# Patient Record
Sex: Female | Born: 2000 | Race: Black or African American | Hispanic: No | Marital: Single | Smoking: Never smoker
Health system: Southern US, Community
[De-identification: ages and names within clinical notes are randomized; demographics above are authoritative.]

## PROBLEM LIST (undated history)

## (undated) DIAGNOSIS — K2970 Gastritis, unspecified, without bleeding: Secondary | ICD-10-CM

## (undated) DIAGNOSIS — J45909 Unspecified asthma, uncomplicated: Secondary | ICD-10-CM

## (undated) DIAGNOSIS — R569 Unspecified convulsions: Secondary | ICD-10-CM

## (undated) HISTORY — PX: SURGERY OF LIP: SUR1315

---
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
## 2020-02-05 ENCOUNTER — Other Ambulatory Visit: Payer: Self-pay

## 2020-02-05 ENCOUNTER — Ambulatory Visit (HOSPITAL_COMMUNITY)
Admission: EM | Admit: 2020-02-05 | Discharge: 2020-02-05 | Disposition: A | Discharge status: Home / Self Care | Payer: Medicaid Other

## 2020-02-06 ENCOUNTER — Other Ambulatory Visit: Payer: Self-pay

## 2020-02-06 ENCOUNTER — Ambulatory Visit
Admission: EM | Admit: 2020-02-06 | Discharge: 2020-02-06 | Disposition: A | Discharge status: Home / Self Care | Payer: Medicaid Other | Attending: Emergency Medicine | Admitting: Emergency Medicine

## 2020-02-06 ENCOUNTER — Encounter: Payer: Self-pay | Admitting: Emergency Medicine

## 2020-02-06 DIAGNOSIS — J069 Acute upper respiratory infection, unspecified: Secondary | ICD-10-CM | POA: Diagnosis not present

## 2020-02-06 DIAGNOSIS — Z20822 Contact with and (suspected) exposure to covid-19: Secondary | ICD-10-CM

## 2020-02-06 MED ORDER — FLUTICASONE PROPIONATE 50 MCG/ACT NA SUSP: 1.0000 | Freq: Every day | NASAL | 0 refills | Status: DC

## 2020-02-06 MED ORDER — BENZONATATE 200 MG PO CAPS: 200.0000 mg | ORAL_CAPSULE | Freq: Three times a day (TID) | ORAL | 0 refills | Status: AC | PRN

## 2020-02-06 MED ORDER — ONDANSETRON 4 MG PO TBDP: 4.0000 mg | ORAL_TABLET | Freq: Three times a day (TID) | ORAL | 0 refills | Status: DC | PRN

## 2020-02-06 NOTE — ED Provider Notes (Signed)
[name]    CSN: [id number] Arrival date & time: 02/06/20  4098      History   Chief Complaint Chief Complaint  Patient presents with   Cough    HPI Kathy Yang is a 19 y.o. female history of asthma presenting today for evaluation of a cough.  Reports over the past 3 days she has had URI symptoms of cough and congestion.  Denies sore throat.  Has also had some nausea and vomiting.  Denies abdominal pain or diarrhea.  Denies any fevers.  Reports possible indirect Covid exposure at work.  HPI  Past Medical History:  Diagnosis Date   Asthma    Gastritis    Seizures ([facility])     There are no problems to display for this patient.   Past Surgical History:  Procedure Laterality Date   SURGERY OF LIP      OB History   No obstetric history on file.      Home Medications    Prior to Admission medications   Medication Sig Start Date End Date Taking? Authorizing Provider  albuterol (PROVENTIL HFA;VENTOLIN HFA) 108 (90 Base) MCG/ACT inhaler Inhale 2 puffs into the lungs every 4 (four) hours as needed for wheezing or shortness of breath. 01/30/18   McDonald, Mia A, PA-C  benzonatate (TESSALON) 200 MG capsule Take 1 capsule (200 mg total) by mouth 3 (three) times daily as needed for up to 7 days for cough. 02/06/20 02/13/20  Suhailah Kwan C, PA-C  fluticasone (FLONASE) 50 MCG/ACT nasal spray Place 1-2 sprays into both nostrils daily. 02/06/20   Amaka Gluth C, PA-C  loperamide (IMODIUM A-D) 2 MG tablet Take 1 tablet (2 mg total) by mouth 4 (four) times daily as needed for diarrhea or loose stools. 04/22/18   Vanetta Mulders, MD  ondansetron (ZOFRAN ODT) 4 MG disintegrating tablet Take 1 tablet (4 mg total) by mouth every 8 (eight) hours as needed for nausea or vomiting. 02/06/20   Nadirah Socorro C, PA-C  pantoprazole (PROTONIX) 40 MG tablet Take 40 mg by mouth daily.    [provider]    Family History Family History  Problem Relation Age  of Onset   Healthy Mother     Social History Social History   Tobacco Use   Smoking status: Never Smoker   Smokeless tobacco: Never Used  Building services engineer Use: Never used  Substance Use Topics   Alcohol use: No   Drug use: No     Allergies   Patient has no known allergies.   Review of Systems Review of Systems  Constitutional: Negative for activity change, appetite change, chills, fatigue and fever.  HENT: Positive for congestion and rhinorrhea. Negative for ear pain, sinus pressure, sore throat and trouble swallowing.   Eyes: Negative for discharge and redness.  Respiratory: Positive for cough. Negative for chest tightness and shortness of breath.   Cardiovascular: Negative for chest pain.  Gastrointestinal: Positive for nausea and vomiting. Negative for abdominal pain and diarrhea.  Musculoskeletal: Negative for myalgias.  Skin: Negative for rash.  Neurological: Negative for dizziness, light-headedness and headaches.     Physical Exam Triage Vital Signs ED Triage Vitals [02/06/20 0851]  Enc Vitals Group     BP 108/69     Pulse Rate 90     Resp 16     Temp 98.5 F (36.9 C)     Temp Source Oral     SpO2 98 %     Weight  Height      Head Circumference      Peak Flow      Pain Score      Pain Loc      Pain Edu?      Excl. in GC?    No data found.  Updated Vital Signs BP 108/69 (BP Location: Right Arm)    Pulse 90    Temp 98.5 F (36.9 C) (Oral)    Resp 16    SpO2 98%   Visual Acuity Right Eye Distance:   Left Eye Distance:   Bilateral Distance:    Right Eye Near:   Left Eye Near:    Bilateral Near:     Physical Exam Vitals and nursing note reviewed.  Constitutional:      Appearance: She is well-developed and well-nourished.     Comments: No acute distress  HENT:     Head: Normocephalic and atraumatic.     Ears:     Comments: Bilateral ears without tenderness to palpation of external auricle, tragus and mastoid, EAC's without  erythema or swelling, TM's with good bony landmarks and cone of light. Non erythematous.     Nose: Nose normal.     Mouth/Throat:     Comments: Oral mucosa pink and moist, no tonsillar enlargement or exudate. Posterior pharynx patent and nonerythematous, no uvula deviation or swelling. Normal phonation. Eyes:     Conjunctiva/sclera: Conjunctivae normal.  Cardiovascular:     Rate and Rhythm: Normal rate.  Pulmonary:     Effort: Pulmonary effort is normal. No respiratory distress.     Comments: Breathing comfortably at rest, CTABL, no wheezing, rales or other adventitious sounds auscultated Abdominal:     General: There is no distension.  Musculoskeletal:        General: Normal range of motion.     Cervical back: Neck supple.  Skin:    General: Skin is warm and dry.  Neurological:     Mental Status: She is alert and oriented to person, place, and time.  Psychiatric:        Mood and Affect: Mood and affect normal.      UC Treatments / Results  Labs (all labs ordered are listed, but only abnormal results are displayed) Labs Reviewed  NOVEL CORONAVIRUS, NAA    EKG   Radiology No results found.  Procedures Procedures (including critical care time)  Medications Ordered in UC Medications - No data to display  Initial Impression / Assessment and Plan / UC Course  I have reviewed the triage vital signs and the nursing notes.  Pertinent labs & imaging results that were available during my care of the patient were reviewed by me and considered in my medical decision making (see chart for details).     Covid PCR pending.  Suspect viral etiology, exam reassuring.  Recommending symptomatic and supportive care of URI and GI symptoms.  Discussed strict return precautions. Patient verbalized understanding and is agreeable with plan.  Final Clinical Impressions(s) / UC Diagnoses   Final diagnoses:  Encounter for screening laboratory testing for COVID-19 virus  Viral URI with  cough     Discharge Instructions     COVID test pending Ibuprofen and tylenol for fevers/body aches/headaches Rest and fluids Flonase for congestion Tessalon for cough Zofran for nausea Follow up if not improving or worsening    ED Prescriptions    Medication Sig Dispense Auth. Provider   benzonatate (TESSALON) 200 MG capsule Take 1 capsule (200 mg total) by  mouth 3 (three) times daily as needed for up to 7 days for cough. 28 capsule Mansfield Dann C, PA-C   fluticasone (FLONASE) 50 MCG/ACT nasal spray Place 1-2 sprays into both nostrils daily. 16 g Victormanuel Mclure C, PA-C   ondansetron (ZOFRAN ODT) 4 MG disintegrating tablet Take 1 tablet (4 mg total) by mouth every 8 (eight) hours as needed for nausea or vomiting. 20 tablet Ritika Hellickson, [place] C, PA-C     PDMP not reviewed this encounter.   Lew Dawes, [place] 02/06/20 [phone]

## 2020-02-06 NOTE — ED Triage Notes (Signed)
Pt here for nasal congestion, cough and some vomiting x 3 days; pt sts had possible covid exposure from work; pt sts hx of GI issues

## 2020-02-06 NOTE — Discharge Instructions (Addendum)
COVID test pending Ibuprofen and tylenol for fevers/body aches/headaches Rest and fluids Flonase for congestion Tessalon for cough Zofran for nausea Follow up if not improving or worsening

## 2020-02-11 LAB — NOVEL CORONAVIRUS, NAA: SARS-CoV-2, NAA: DETECTED — AB

## 2020-04-22 ENCOUNTER — Ambulatory Visit
Admission: EM | Admit: 2020-04-22 | Discharge: 2020-04-22 | Disposition: A | Discharge status: Home / Self Care | Payer: Medicaid Other | Attending: Emergency Medicine | Admitting: Emergency Medicine

## 2020-04-22 ENCOUNTER — Other Ambulatory Visit: Payer: Self-pay

## 2020-04-22 DIAGNOSIS — M546 Pain in thoracic spine: Secondary | ICD-10-CM | POA: Diagnosis not present

## 2020-04-22 MED ORDER — IBUPROFEN 800 MG PO TABS: 800.0000 mg | ORAL_TABLET | Freq: Three times a day (TID) | ORAL | 0 refills | Status: DC

## 2020-04-22 MED ORDER — TIZANIDINE HCL 4 MG PO TABS: 2.0000 mg | ORAL_TABLET | Freq: Four times a day (QID) | ORAL | 0 refills | Status: DC | PRN

## 2020-04-22 NOTE — ED Triage Notes (Signed)
Pt c/o mid center back pain for months. States hx of fx tail bone 14yrs ago. Denies injury. States pain became worse yesterday while at work.

## 2020-04-22 NOTE — Discharge Instructions (Signed)
Use anti-inflammatories for pain/swelling. You may take up to 800 mg Ibuprofen every 8 hours with food. You may supplement Ibuprofen with Tylenol [phone] mg every 8 hours.   You may use tizanidine as needed to help with pain. This is a muscle relaxer and causes sedation- please use only at bedtime or when you will be home and not have to drive/work  Alternate ice and heat  Follow up with sports medicine if pain persisting
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
## 2020-05-27 ENCOUNTER — Encounter (HOSPITAL_COMMUNITY): Payer: Self-pay

## 2020-05-27 ENCOUNTER — Emergency Department (HOSPITAL_COMMUNITY)
Admission: EM | Admit: 2020-05-27 | Discharge: 2020-05-27 | Disposition: A | Discharge status: Home / Self Care | Payer: Medicaid Other | Attending: Emergency Medicine | Admitting: Emergency Medicine

## 2020-05-27 DIAGNOSIS — R112 Nausea with vomiting, unspecified: Secondary | ICD-10-CM | POA: Insufficient documentation

## 2020-05-27 DIAGNOSIS — J45909 Unspecified asthma, uncomplicated: Secondary | ICD-10-CM | POA: Insufficient documentation

## 2020-05-27 DIAGNOSIS — R197 Diarrhea, unspecified: Secondary | ICD-10-CM | POA: Insufficient documentation

## 2020-05-27 MED ORDER — ONDANSETRON 4 MG PO TBDP
4.0000 mg | ORAL_TABLET | Freq: Once | ORAL | Status: AC
  Administered 2020-05-27: 4 mg via ORAL
  Filled 2020-05-27: qty 1

## 2020-05-27 MED ORDER — PROMETHAZINE HCL 12.5 MG PO TABS: 12.5000 mg | ORAL_TABLET | Freq: Four times a day (QID) | ORAL | 0 refills | Status: AC | PRN

## 2020-05-27 MED ORDER — PROMETHAZINE HCL 25 MG PO TABS
12.5000 mg | ORAL_TABLET | Freq: Once | ORAL | Status: AC
  Administered 2020-05-27: 12.5 mg via ORAL
  Filled 2020-05-27: qty 1

## 2020-05-27 NOTE — ED Notes (Signed)
Provided patient water per fluid challenge. Patient began to vomit shortly after drinking water.

## 2020-05-27 NOTE — ED Triage Notes (Signed)
Pt arrived via walk in, c/o vomiting and diarrhea since 10pm . Denies any other sx or known sick contacts.

## 2020-05-27 NOTE — ED Provider Notes (Signed)
[facility]-EMERGENCY DEPT Provider Note   CSN: [id number] Arrival date & time: 05/27/20  0255     History Chief Complaint  Patient presents with  . Emesis    Kathy Yang is a 20 y.o. female who presents the emergency department with a chief complaint of nausea, vomiting, and diarrhea.  The patient reports sudden onset nausea, vomiting, and diarrhea, onset 10 PM.  She reports 1 episode of diarrhea and multiple episodes of nonbloody, nonbilious vomiting.  No known aggravating or alleviating factors. She reports that symptoms began after eating food.  She denies abdominal pain, fever, chills, cough, chest pain, shortness of breath, dysuria, hematuria, vaginal bleeding or discharge.  No treatment prior to arrival.  No known sick contacts.  The history is provided by the patient and medical records. No language interpreter was used.       Past Medical History:  Diagnosis Date  . Asthma   . Gastritis   . Seizures (HCC)     There are no problems to display for this patient.   Past Surgical History:  Procedure Laterality Date  . SURGERY OF LIP       OB History   No obstetric history on file.     Family History  Problem Relation Age of Onset  . Healthy Mother     Social History   Tobacco Use  . Smoking status: Never Smoker  . Smokeless tobacco: Never Used  Vaping Use  . Vaping Use: Never used  Substance Use Topics  . Alcohol use: No  . Drug use: No    Home Medications Prior to Admission medications   Medication Sig Start Date End Date Taking? Authorizing Provider  promethazine (PHENERGAN) 12.5 MG tablet Take 1 tablet (12.5 mg total) by mouth every 6 (six) hours as needed for nausea or vomiting. 05/27/20  Yes Diante Barley A, PA-C  albuterol (PROVENTIL HFA;VENTOLIN HFA) 108 (90 Base) MCG/ACT inhaler Inhale 2 puffs into the lungs every 4 (four) hours as needed for wheezing or shortness of breath. Patient not taking: Reported on 05/27/2020  01/30/18 05/27/20  Berlynn Warsame, Pedro Earls A, PA-C    Allergies    Patient has no known allergies.  Review of Systems   Review of Systems  Constitutional: Negative for activity change, chills and fever.  HENT: Negative for congestion and sore throat.   Eyes: Negative for visual disturbance.  Respiratory: Negative for cough and shortness of breath.   Cardiovascular: Negative for chest pain and palpitations.  Gastrointestinal: Positive for diarrhea, nausea and vomiting. Negative for abdominal pain and constipation.  Genitourinary: Negative for dysuria.  Musculoskeletal: Negative for back pain, myalgias, neck pain and neck stiffness.  Skin: Negative for rash.  Allergic/Immunologic: Negative for immunocompromised state.  Neurological: Negative for seizures, syncope, weakness, numbness and headaches.  Psychiatric/Behavioral: Negative for confusion.    Physical Exam Updated Vital Signs BP 105/61   Pulse 71   Temp 98.1 F (36.7 C) (Oral)   Resp 19   SpO2 98%   Physical Exam Vitals and nursing note reviewed.  Constitutional:      General: She is not in acute distress. HENT:     Head: Normocephalic.     Mouth/Throat:     Mouth: Mucous membranes are moist.     Pharynx: No oropharyngeal exudate or posterior oropharyngeal erythema.  Eyes:     Conjunctiva/sclera: Conjunctivae normal.  Cardiovascular:     Rate and Rhythm: Normal rate and regular rhythm.     Heart sounds: No  murmur heard. No friction rub. No gallop.   Pulmonary:     Effort: Pulmonary effort is normal. No respiratory distress.     Breath sounds: No stridor. No wheezing, rhonchi or rales.  Chest:     Chest wall: No tenderness.  Abdominal:     General: There is no distension.     Palpations: Abdomen is soft. There is no mass.     Tenderness: There is no abdominal tenderness. There is no right CVA tenderness, left CVA tenderness, guarding or rebound.     Hernia: No hernia is present.     Comments: Abdomen is soft,  nontender, nondistended.  Musculoskeletal:     Cervical back: Neck supple.  Skin:    General: Skin is warm.     Capillary Refill: Capillary refill takes less than 2 seconds.     Coloration: Skin is not jaundiced or pale.     Findings: No erythema or rash.  Neurological:     Mental Status: She is alert.  Psychiatric:        Behavior: Behavior normal.     ED Results / Procedures / Treatments   Labs (all labs ordered are listed, but only abnormal results are displayed) Labs Reviewed - No data to display  EKG None  Radiology No results found.  Procedures Procedures   Medications Ordered in ED Medications  ondansetron (ZOFRAN-ODT) disintegrating tablet 4 mg (4 mg Oral Given 05/27/20 0324)  promethazine (PHENERGAN) tablet 12.5 mg (12.5 mg Oral Given 05/27/20 0406)    ED Course  I have reviewed the triage vital signs and the nursing notes.  Pertinent labs & imaging results that were available during my care of the patient were reviewed by me and considered in my medical decision making (see chart for details).    MDM Rules/Calculators/A&P                          20 year old female with no chronic medical conditions who presents with nausea, vomiting, and diarrhea, onset 5 hours ago.  No abdominal pain or constitutional symptoms.  Vital signs are normal.  On exam, abdomen is benign.  In a shared decision-making conversation with the patient.  Discussed since her labs were normal and symptoms began tonight, to manage her symptoms with antiemetics and fluid challenge the patient.  She is agreement with this plan.  She was given Zofran, but continues to endorse nausea.  She was trialed with Phenergan and on reevaluation nausea had resolved.  She had no further episodes of diarrhea.  She was able to tolerate fluids without difficulty.  Reports that she was feeling much improved.  Suspect viral gastroenteritis versus undercooked food ingestion. We will discharge patient to home with  outpatient follow-up as needed.  Doubt appendicitis, cholecystitis, pancreatitis, ectopic pregnancy, pyelonephritis, bowel obstruction.  She is hemodynamically stable in no acute distress.  Safe for discharge home with outpatient follow-up as indicated.  Final Clinical Impression(s) / ED Diagnoses Final diagnoses:  Nausea vomiting and diarrhea    Rx / DC Orders ED Discharge Orders         Ordered    promethazine (PHENERGAN) 12.5 MG tablet  Every 6 hours PRN        05/27/20 0531           Barkley Boards, PA-C 05/27/20 0741    Nira Conn, MD 05/28/20 [phone]

## 2020-05-27 NOTE — Discharge Instructions (Signed)
Thank you for allowing me to care for you today in the Emergency Department.   Please keep your upcoming follow-up appointment.  You can take 1 tablet of Zofran every 6 hours as needed for nausea vomiting.  Make sure that you are drinking plenty of fluids over the next few days to prevent dehydration.  Return to the emergency department if you stop producing urine, have persistent, uncontrollable vomiting despite taking Phenergan, if you develop a fever greater than 100.5 F accompanied by severe abdominal pain, or other new, concerning symptoms.

## 2020-12-12 ENCOUNTER — Encounter (HOSPITAL_BASED_OUTPATIENT_CLINIC_OR_DEPARTMENT_OTHER): Payer: Self-pay

## 2020-12-12 ENCOUNTER — Other Ambulatory Visit: Payer: Self-pay

## 2020-12-12 ENCOUNTER — Emergency Department (HOSPITAL_BASED_OUTPATIENT_CLINIC_OR_DEPARTMENT_OTHER): Payer: Medicaid Other

## 2020-12-12 ENCOUNTER — Emergency Department (HOSPITAL_BASED_OUTPATIENT_CLINIC_OR_DEPARTMENT_OTHER)
Admission: EM | Admit: 2020-12-12 | Discharge: 2020-12-12 | Disposition: A | Discharge status: Home / Self Care | Payer: Medicaid Other | Attending: Emergency Medicine | Admitting: Emergency Medicine

## 2020-12-12 DIAGNOSIS — Y92410 Unspecified street and highway as the place of occurrence of the external cause: Secondary | ICD-10-CM | POA: Insufficient documentation

## 2020-12-12 DIAGNOSIS — S161XXA Strain of muscle, fascia and tendon at neck level, initial encounter: Secondary | ICD-10-CM | POA: Insufficient documentation

## 2020-12-12 DIAGNOSIS — S93402A Sprain of unspecified ligament of left ankle, initial encounter: Secondary | ICD-10-CM | POA: Diagnosis not present

## 2020-12-12 DIAGNOSIS — J45909 Unspecified asthma, uncomplicated: Secondary | ICD-10-CM | POA: Diagnosis not present

## 2020-12-12 DIAGNOSIS — S199XXA Unspecified injury of neck, initial encounter: Secondary | ICD-10-CM | POA: Diagnosis present

## 2020-12-12 MED ORDER — METHOCARBAMOL 500 MG PO TABS: 500.0000 mg | ORAL_TABLET | Freq: Two times a day (BID) | ORAL | 0 refills | Status: DC
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
## 2020-12-12 NOTE — ED Notes (Signed)
TRIAGE NURSING ASSESSMENT COMPLETED BY M. Dymond Spreen RN 
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
## 2021-03-14 ENCOUNTER — Encounter (HOSPITAL_COMMUNITY): Payer: Self-pay

## 2021-03-14 ENCOUNTER — Emergency Department (HOSPITAL_COMMUNITY): Payer: Medicaid Other

## 2021-03-14 ENCOUNTER — Emergency Department (HOSPITAL_COMMUNITY)
Admission: EM | Admit: 2021-03-14 | Discharge: 2021-03-14 | Disposition: A | Discharge status: Home / Self Care | Payer: Medicaid Other | Attending: Emergency Medicine | Admitting: Emergency Medicine

## 2021-03-14 DIAGNOSIS — M542 Cervicalgia: Secondary | ICD-10-CM | POA: Insufficient documentation

## 2021-03-14 DIAGNOSIS — R52 Pain, unspecified: Secondary | ICD-10-CM

## 2021-03-14 DIAGNOSIS — Z79899 Other long term (current) drug therapy: Secondary | ICD-10-CM | POA: Insufficient documentation

## 2021-03-14 DIAGNOSIS — S0990XA Unspecified injury of head, initial encounter: Secondary | ICD-10-CM | POA: Insufficient documentation

## 2021-03-14 MED ORDER — ACETAMINOPHEN 500 MG PO TABS
1000.0000 mg | ORAL_TABLET | Freq: Once | ORAL | Status: AC
  Administered 2021-03-14: 1000 mg via ORAL
  Filled 2021-03-14: qty 2

## 2021-03-14 MED ORDER — IBUPROFEN 800 MG PO TABS
800.0000 mg | ORAL_TABLET | Freq: Once | ORAL | Status: AC
  Administered 2021-03-14: 800 mg via ORAL
  Filled 2021-03-14: qty 1

## 2021-03-14 MED ORDER — METHOCARBAMOL 500 MG PO TABS: 500.0000 mg | ORAL_TABLET | Freq: Two times a day (BID) | ORAL | 0 refills | Status: DC
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
## 2021-03-14 NOTE — ED Notes (Signed)
Patient was able to ambulate down the hall. She did find ambulation difficult stopping every few steps due to back pain.
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
## 2021-06-26 ENCOUNTER — Encounter (HOSPITAL_BASED_OUTPATIENT_CLINIC_OR_DEPARTMENT_OTHER): Payer: Self-pay | Admitting: Emergency Medicine

## 2021-06-26 ENCOUNTER — Emergency Department (HOSPITAL_BASED_OUTPATIENT_CLINIC_OR_DEPARTMENT_OTHER)
Admission: EM | Admit: 2021-06-26 | Discharge: 2021-06-26 | Disposition: A | Discharge status: Home / Self Care | Payer: Medicaid Other | Attending: Emergency Medicine | Admitting: Emergency Medicine

## 2021-06-26 ENCOUNTER — Other Ambulatory Visit: Payer: Self-pay

## 2021-06-26 DIAGNOSIS — R112 Nausea with vomiting, unspecified: Secondary | ICD-10-CM | POA: Diagnosis present

## 2021-06-26 DIAGNOSIS — A084 Viral intestinal infection, unspecified: Secondary | ICD-10-CM | POA: Diagnosis not present

## 2021-06-26 LAB — CBC
HCT: 39 % (ref 36.0–46.0)
Hemoglobin: 13.5 g/dL (ref 12.0–15.0)
MCH: 30.6 pg (ref 26.0–34.0)
MCHC: 34.6 g/dL (ref 30.0–36.0)
MCV: 88.4 fL (ref 80.0–100.0)
Platelets: 162 10*3/uL (ref 150–400)
RBC: 4.41 MIL/uL (ref 3.87–5.11)
RDW: 11.9 % (ref 11.5–15.5)
WBC: 6.2 10*3/uL (ref 4.0–10.5)
nRBC: 0 % (ref 0.0–0.2)

## 2021-06-26 LAB — URINALYSIS, ROUTINE W REFLEX MICROSCOPIC
Bilirubin Urine: NEGATIVE
Glucose, UA: NEGATIVE mg/dL
Hgb urine dipstick: NEGATIVE
Ketones, ur: 15 mg/dL — AB
Leukocytes,Ua: NEGATIVE
Nitrite: NEGATIVE
Protein, ur: NEGATIVE mg/dL
Specific Gravity, Urine: 1.02 (ref 1.005–1.030)
pH: 7.5 (ref 5.0–8.0)

## 2021-06-26 LAB — HEPATIC FUNCTION PANEL
ALT: 12 U/L (ref 0–44)
AST: 16 U/L (ref 15–41)
Albumin: 4.3 g/dL (ref 3.5–5.0)
Alkaline Phosphatase: 56 U/L (ref 38–126)
Bilirubin, Direct: 0.3 mg/dL — ABNORMAL HIGH (ref 0.0–0.2)
Indirect Bilirubin: 1.9 mg/dL — ABNORMAL HIGH (ref 0.3–0.9)
Total Bilirubin: 2.2 mg/dL — ABNORMAL HIGH (ref 0.3–1.2)
Total Protein: 7.6 g/dL (ref 6.5–8.1)

## 2021-06-26 LAB — BASIC METABOLIC PANEL
Anion gap: 6 (ref 5–15)
BUN: 7 mg/dL (ref 6–20)
CO2: 23 mmol/L (ref 22–32)
Calcium: 8.8 mg/dL — ABNORMAL LOW (ref 8.9–10.3)
Chloride: 108 mmol/L (ref 98–111)
Creatinine, Ser: 0.61 mg/dL (ref 0.44–1.00)
GFR, Estimated: >60 mL/min (ref >60)
Glucose, Bld: 97 mg/dL (ref 70–99)
Potassium: 3.7 mmol/L (ref 3.5–5.1)
Sodium: 137 mmol/L (ref 135–145)

## 2021-06-26 LAB — LIPASE, BLOOD: Lipase: 27 U/L (ref 11–51)

## 2021-06-26 LAB — PREGNANCY, URINE: Preg Test, Ur: NEGATIVE

## 2021-06-26 MED ORDER — KETOROLAC TROMETHAMINE 15 MG/ML IJ SOLN
15.0000 mg | Freq: Once | INTRAMUSCULAR | Status: AC
  Administered 2021-06-26: 15 mg via INTRAVENOUS
  Filled 2021-06-26: qty 1

## 2021-06-26 MED ORDER — SODIUM CHLORIDE 0.9 % IV BOLUS
1000.0000 mL | Freq: Once | INTRAVENOUS | Status: AC
  Administered 2021-06-26: 1000 mL via INTRAVENOUS

## 2021-06-26 MED ORDER — ONDANSETRON HCL 4 MG PO TABS: 4.0000 mg | ORAL_TABLET | Freq: Four times a day (QID) | ORAL | 0 refills | Status: DC

## 2021-06-26 MED ORDER — ONDANSETRON HCL 4 MG/2ML IJ SOLN
4.0000 mg | Freq: Once | INTRAMUSCULAR | Status: AC
  Administered 2021-06-26: 4 mg via INTRAVENOUS
  Filled 2021-06-26: qty 2

## 2021-06-26 NOTE — ED Notes (Signed)
Pt aware of need for urine specimen, unable to provide at this time.  IV fluids initiated ?

## 2021-06-26 NOTE — ED Notes (Signed)
Patient states she cannt void ?

## 2021-06-26 NOTE — ED Notes (Signed)
Pt returned from bathroom, ambulating with steady gait, Iv fluids completed, pt reports ongoing headache.   ?

## 2021-06-26 NOTE — ED Provider Notes (Signed)
?[facility] EMERGENCY DEPARTMENT ?Provider Note ? ? ?CSN: [id number] ?Arrival date & time: 06/26/21  1344 ? ?  ? ?History ? ?Chief Complaint  ?Patient presents with  ? Fatigue  ? ? ?Kathy Yang is a 21 y.o. female who presents emergency department complaining of nausea, vomiting, headache, lightheadedness, and sore throat for 3 days.  Patient states that the past 3 days she vomited 1 time, but today she vomited 6 times.  She does have a history of gastritis.  She denies fever, abdominal pain, diarrhea, urinary symptoms, vaginal discharge.  States she is tolerating fluids, but not food. ? ?HPI ? ?  ? ?Home Medications ?Prior to Admission medications   ?Medication Sig Start Date End Date Taking? Authorizing Provider  ?ondansetron (ZOFRAN) 4 MG tablet Take 1 tablet (4 mg total) by mouth every 6 (six) hours. 06/26/21  Yes Azel Gumina T, PA-C  ?methocarbamol (ROBAXIN) 500 MG tablet Take 1 tablet (500 mg total) by mouth 2 (two) times daily. 03/14/21   Mickie Hillier, PA-C  ?promethazine (PHENERGAN) 12.5 MG tablet Take 1 tablet (12.5 mg total) by mouth every 6 (six) hours as needed for nausea or vomiting. 05/27/20   McDonald, Mia A, PA-C  ?albuterol (PROVENTIL HFA;VENTOLIN HFA) 108 (90 Base) MCG/ACT inhaler Inhale 2 puffs into the lungs every 4 (four) hours as needed for wheezing or shortness of breath. ?Patient not taking: Reported on 05/27/2020 01/30/18 05/27/20  McDonald, Maree Erie A, PA-C  ?   ? ?Allergies    ?Patient has no known allergies.   ? ?Review of Systems   ?Review of Systems  ?Constitutional:  Negative for fever.  ?HENT:  Positive for sore throat.   ?Respiratory:  Negative for shortness of breath.   ?Cardiovascular:  Negative for chest pain.  ?Gastrointestinal:  Positive for nausea and vomiting. Negative for abdominal pain, constipation and diarrhea.  ?Genitourinary:  Negative for dysuria, frequency, hematuria and urgency.  ?Neurological:  Positive for light-headedness and headaches.  ?All other systems  reviewed and are negative. ? ?Physical Exam ?Updated Vital Signs ?BP (!) 109/51 (BP Location: Right Arm)   Pulse (!) 101   Temp 98.6 ?F (37 ?C) (Oral)   Resp 16   Wt 53.5 kg   LMP 06/18/2021 (Exact Date)   SpO2 98%   BMI 20.90 kg/m?  ?Physical Exam ?Vitals and nursing note reviewed.  ?Constitutional:   ?   Appearance: Normal appearance.  ?HENT:  ?   Head: Normocephalic and atraumatic.  ?   Mouth/Throat:  ?   Lips: Pink.  ?   Mouth: Mucous membranes are moist.  ?   Pharynx: Oropharynx is clear. Uvula midline.  ?   Tonsils: No tonsillar exudate. 2+ on the right. 2+ on the left.  ?Eyes:  ?   Conjunctiva/sclera: Conjunctivae normal.  ?Cardiovascular:  ?   Rate and Rhythm: Normal rate and regular rhythm.  ?Pulmonary:  ?   Effort: Pulmonary effort is normal. No respiratory distress.  ?   Breath sounds: Normal breath sounds.  ?Abdominal:  ?   General: There is no distension.  ?   Palpations: Abdomen is soft.  ?   Tenderness: There is generalized abdominal tenderness. There is no guarding or rebound.  ?Skin: ?   General: Skin is warm and dry.  ?Neurological:  ?   General: No focal deficit present.  ?   Mental Status: She is alert.  ? ? ?ED Results / Procedures / Treatments   ?Labs ?(all labs ordered are listed,  but only abnormal results are displayed) ?Labs Reviewed  ?BASIC METABOLIC PANEL - Abnormal; Notable for the following components:  ?    Result Value  ? Calcium 8.8 (*)   ? All other components within normal limits  ?URINALYSIS, ROUTINE W REFLEX MICROSCOPIC - Abnormal; Notable for the following components:  ? Ketones, ur 15 (*)   ? All other components within normal limits  ?HEPATIC FUNCTION PANEL - Abnormal; Notable for the following components:  ? Total Bilirubin 2.2 (*)   ? Bilirubin, Direct 0.3 (*)   ? Indirect Bilirubin 1.9 (*)   ? All other components within normal limits  ?CBC  ?PREGNANCY, URINE  ?LIPASE, BLOOD  ? ? ?EKG ?None ? ?Radiology ?No results found. ? ?Procedures ?Procedures  ? ? ?Medications  Ordered in ED ?Medications  ?sodium chloride 0.9 % bolus 1,000 mL (0 mLs Intravenous Stopped 06/26/21 1626)  ?ondansetron [facility]) injection 4 mg (4 mg Intravenous Given 06/26/21 1519)  ?ketorolac (TORADOL) 15 MG/ML injection 15 mg (15 mg Intravenous Given 06/26/21 1632)  ? ? ?ED Course/ Medical Decision Making/ A&P ?  ?                        ?Medical Decision Making ?Amount and/or Complexity of Data Reviewed ?Labs: ordered. ? ?Risk ?Prescription drug management. ? ? ?This patient is a 21 y.o. female who presents to the ED for concern of vomiting, this involves an extensive number of treatment options, and is a complaint that carries with it a high risk of complications and morbidity. The emergent differential diagnosis prior to evaluation includes, but is not limited to,  ACS/MI, Boerhaave's, DKA, elevated ICP, Ischemic bowel, Sepsis, Drug-related (toxicity, THC hyperemesis, ETOH, withdrawal), Appendicitis, Bowel obstruction, Electrolyte abnormalities, Pancreatitis, Biliary colic, Gastroenteritis, Gastroparesis, Hepatitis, Migraine, Thyroid disease, Renal colic, GERD/PUD, UTI, Ovarian torsion, Pregnancy, Hyperemesis gravidarum . This is not an exhaustive differential.  ? ?Past Medical History / Co-morbidities / Social History: ?Asthma, seizures, gastritis ? ?Additional history: ?Chart reviewed. Pertinent results include: Patient seen several times in the past several years with similar symptoms. Dx with chronic gastritis. Does not see GI per chart review.  ? ?Physical Exam: ?Physical exam performed. The pertinent findings include: Tachycardic to 116, afebrile.  Patient in no acute distress.  Abdomen soft, generalized tenderness.  No guarding. ? ?Lab Tests: ?I ordered, and personally interpreted labs.  The pertinent results include: No leukocytosis, normal hemoglobin.  Electrolytes within normal limits.  Urinalysis negative for infection or hematuria.  Negative pregnancy.  Normal lipase. ?  ?Medications: ?I ordered  medication including IV fluids, Zofran, and Toradol for nausea, lightheadedness, and headache. Reevaluation of the patient after these medicines showed that the patient improved. I have reviewed the patients home medicines and have made adjustments as needed. ? ?Disposition: ?After consideration of the diagnostic results and the patients response to treatment, I feel that patient's not requiring admission or inpatient treatment for symptoms.  Symptoms most consistent with a viral gastroenteritis.  Repeat abdominal exam remains nonsurgical.  On reevaluation patient states that she feels much better, and is ready to go home.  Will discharge to home with prescription for Zofran.  Patient plans to follow-up with gastroenterology about her chronic gastritis, and I encouraged this.  We discussed reasons to return to the emergency department, and patient is agreeable to the plan. ? ?Final Clinical Impression(s) / ED Diagnoses ?Final diagnoses:  ?Viral gastroenteritis  ? ? ?Rx / DC Orders ?ED Discharge Orders   ? ?  Ordered  ?  ondansetron (ZOFRAN) 4 MG tablet  Every 6 hours       ? 06/26/21 1705  ? ?  ?  ? ?  ? ?Portions of this report may have been transcribed using voice recognition software. Every effort was made to ensure accuracy; however, inadvertent computerized transcription errors may be present. ? ?  ?Kateri Plummer, PA-C ?06/26/21 1738 ? ?  ?Lucrezia Starch, MD ?06/26/21 1748 ? ?

## 2021-06-26 NOTE — ED Triage Notes (Signed)
Pt reports n/v x 3 days, and now has HA and feeling lightheaded. Vomit is yellow. Denies abdominal pain, urinary sx, vaginal discharge. Hx of gastritis.  ?

## 2021-06-26 NOTE — Discharge Instructions (Addendum)
You were seen in emergency department today for fatigue and vomiting. ? ?As we discussed I think you likely have a viral gastrointestinal infection.  I recommend continuing to hydrate well at home.  I am sending you a prescription for the same nausea medicine that you got in the ER. ? ?I recommend following up with gastroenterology as you are able. ? ?Continue to monitor how you're doing and return to the ER for new or worsening symptoms.  ?

## 2021-06-26 NOTE — ED Notes (Signed)
Patient states she has been vomiting since Tuesday. States she has vomited six times today.States her throat is sore. Denies any fever ?

## 2021-10-09 ENCOUNTER — Emergency Department (HOSPITAL_BASED_OUTPATIENT_CLINIC_OR_DEPARTMENT_OTHER)
Admission: EM | Admit: 2021-10-09 | Discharge: 2021-10-09 | Disposition: A | Discharge status: Home / Self Care | Payer: Medicaid Other | Attending: Emergency Medicine | Admitting: Emergency Medicine

## 2021-10-09 ENCOUNTER — Encounter (HOSPITAL_BASED_OUTPATIENT_CLINIC_OR_DEPARTMENT_OTHER): Payer: Self-pay | Admitting: Emergency Medicine

## 2021-10-09 DIAGNOSIS — N9489 Other specified conditions associated with female genital organs and menstrual cycle: Secondary | ICD-10-CM | POA: Insufficient documentation

## 2021-10-09 DIAGNOSIS — G43909 Migraine, unspecified, not intractable, without status migrainosus: Secondary | ICD-10-CM | POA: Diagnosis present

## 2021-10-09 DIAGNOSIS — J45909 Unspecified asthma, uncomplicated: Secondary | ICD-10-CM | POA: Diagnosis not present

## 2021-10-09 DIAGNOSIS — G43409 Hemiplegic migraine, not intractable, without status migrainosus: Secondary | ICD-10-CM | POA: Insufficient documentation

## 2021-10-09 LAB — HCG, SERUM, QUALITATIVE: Preg, Serum: NEGATIVE

## 2021-10-09 MED ORDER — KETOROLAC TROMETHAMINE 15 MG/ML IJ SOLN
15.0000 mg | Freq: Once | INTRAMUSCULAR | Status: AC | PRN
  Administered 2021-10-09: 15 mg via INTRAVENOUS
  Filled 2021-10-09: qty 1

## 2021-10-09 MED ORDER — SODIUM CHLORIDE 0.9 % IV BOLUS
1000.0000 mL | Freq: Once | INTRAVENOUS | Status: AC
  Administered 2021-10-09: 1000 mL via INTRAVENOUS

## 2021-10-09 MED ORDER — METOCLOPRAMIDE HCL 5 MG/ML IJ SOLN
10.0000 mg | Freq: Once | INTRAMUSCULAR | Status: AC
  Administered 2021-10-09: 10 mg via INTRAVENOUS
  Filled 2021-10-09: qty 2

## 2021-10-09 MED ORDER — DIPHENHYDRAMINE HCL 50 MG/ML IJ SOLN
25.0000 mg | Freq: Once | INTRAMUSCULAR | Status: AC
  Administered 2021-10-09: 25 mg via INTRAVENOUS
  Filled 2021-10-09: qty 1

## 2021-10-09 NOTE — ED Triage Notes (Addendum)
Migrane X 1 week started having emesis today, tried home meds with no relief. Unable to hold down meals.

## 2021-10-09 NOTE — ED Provider Notes (Signed)
[facility] Provider Note: Lowella Dell, MD, FACEP  CSN: [id number] MRN: [id number] ARRIVAL: 10/09/21 at 0007 ROOM: [facility]03/[facility]03   CHIEF COMPLAINT  Migraine   HISTORY OF PRESENT ILLNESS  10/09/21 1:46 AM Kathy Yang is a 21 y.o. female with a family history of migraines.  She has been having a frontal headache off and on for the past week.  The headache worsened yesterday and she now rates it as a 9 out of 10.  It has not responded to Tylenol.  She has had associated nausea and vomiting (about 6 episodes) as well as photophobia.   Past Medical History:  Diagnosis Date   Asthma    Gastritis    Seizures (HCC)     Past Surgical History:  Procedure Laterality Date   SURGERY OF LIP      Family History  Problem Relation Age of Onset   Healthy Mother     Social History   Tobacco Use   Smoking status: Never   Smokeless tobacco: Never  Vaping Use   Vaping Use: Never used  Substance Use Topics   Alcohol use: No   Drug use: No    Prior to Admission medications   Medication Sig Start Date End Date Taking? Authorizing Provider  methocarbamol (ROBAXIN) 500 MG tablet Take 1 tablet (500 mg total) by mouth 2 (two) times daily. 03/14/21   Cristopher Peru, PA-C  ondansetron (ZOFRAN) 4 MG tablet Take 1 tablet (4 mg total) by mouth every 6 (six) hours. 06/26/21   Roemhildt, Lorin T, PA-C  promethazine (PHENERGAN) 12.5 MG tablet Take 1 tablet (12.5 mg total) by mouth every 6 (six) hours as needed for nausea or vomiting. 05/27/20   McDonald, Mia A, PA-C  albuterol (PROVENTIL HFA;VENTOLIN HFA) 108 (90 Base) MCG/ACT inhaler Inhale 2 puffs into the lungs every 4 (four) hours as needed for wheezing or shortness of breath. Patient not taking: Reported on 05/27/2020 01/30/18 05/27/20  McDonald, Pedro Earls A, PA-C    Allergies Patient has no known allergies.   REVIEW OF SYSTEMS  Negative except as noted here or in the History of Present Illness.   PHYSICAL EXAMINATION  Initial  Vital Signs Blood pressure 106/74, pulse (!) 101, temperature 98.7 F (37.1 C), temperature source Oral, resp. rate 18, height 5\' 4"  (1.626 m), weight 52.2 kg, last menstrual period 09/28/2021, SpO2 100 %.  Examination General: Well-developed, thin female in no acute distress; appearance consistent with age of record HENT: normocephalic; atraumatic Eyes: pupils equal, round and reactive to light; extraocular muscles intact; photophobia Neck: supple Heart: regular rate and rhythm Lungs: clear to auscultation bilaterally Abdomen: soft; nondistended; nontender; bowel sounds present Extremities: No deformity; full range of motion Neurologic: Awake, alert and oriented; motor function intact in all extremities and symmetric; no facial droop Skin: Warm and dry Psychiatric: Normal mood and affect   RESULTS  Summary of this visit's results, reviewed and interpreted by myself:   EKG Interpretation  Date/Time:    Ventricular Rate:    PR Interval:    QRS Duration:   QT Interval:    QTC Calculation:   R Axis:     Text Interpretation:         Laboratory Studies: Results for orders placed or performed during the hospital encounter of 10/09/21 (from the past 24 hour(s))  hCG, serum, qualitative     Status: None   Collection Time: 10/09/21  2:43 AM  Result Value Ref Range   Preg, Serum NEGATIVE NEGATIVE  Imaging Studies: No results found.  ED COURSE and MDM  Nursing notes, initial and subsequent vitals signs, including pulse oximetry, reviewed and interpreted by myself.  Vitals:   10/09/21 0019 10/09/21 0300 10/09/21 0330 10/09/21 0430  BP: 106/74 102/68 98/66   Pulse: (!) 101 83 85   Resp: 18 18 18    Temp: 98.7 F (37.1 C)   98.5 F (36.9 C)  TempSrc: Oral   Oral  SpO2: 100% 100% 99%   Weight:      Height:       Medications  sodium chloride 0.9 % bolus 1,000 mL (0 mLs Intravenous Stopped 10/09/21 0354)  diphenhydrAMINE (BENADRYL) injection 25 mg (25 mg Intravenous Given  10/09/21 0238)  metoCLOPramide (REGLAN) injection 10 mg (10 mg Intravenous Given 10/09/21 0238)  ketorolac (TORADOL) 15 MG/ML injection 15 mg (15 mg Intravenous Given 10/09/21 0239)   4:49 AM Patient feeling much better after IV fluids and medications.  She states she is ready to go home.  Presentation consistent with a migraine.   PROCEDURES  Procedures   ED DIAGNOSES     ICD-10-CM   1. Sporadic migraine  G43.409          Miyah Hampshire, MD 10/09/21 [phone]

## 2021-12-26 ENCOUNTER — Emergency Department (HOSPITAL_BASED_OUTPATIENT_CLINIC_OR_DEPARTMENT_OTHER)
Admission: EM | Admit: 2021-12-26 | Discharge: 2021-12-27 | Disposition: A | Discharge status: Home / Self Care | Payer: Medicaid Other | Attending: Emergency Medicine | Admitting: Emergency Medicine

## 2021-12-26 ENCOUNTER — Encounter (HOSPITAL_BASED_OUTPATIENT_CLINIC_OR_DEPARTMENT_OTHER): Payer: Self-pay | Admitting: Emergency Medicine

## 2021-12-26 ENCOUNTER — Emergency Department (HOSPITAL_BASED_OUTPATIENT_CLINIC_OR_DEPARTMENT_OTHER): Payer: Medicaid Other

## 2021-12-26 DIAGNOSIS — R519 Headache, unspecified: Secondary | ICD-10-CM | POA: Diagnosis not present

## 2021-12-26 DIAGNOSIS — R040 Epistaxis: Secondary | ICD-10-CM | POA: Diagnosis not present

## 2021-12-26 DIAGNOSIS — S00531A Contusion of lip, initial encounter: Secondary | ICD-10-CM | POA: Diagnosis not present

## 2021-12-26 DIAGNOSIS — S0993XA Unspecified injury of face, initial encounter: Secondary | ICD-10-CM | POA: Diagnosis present

## 2021-12-26 MED ORDER — OXYMETAZOLINE HCL 0.05 % NA SOLN: 1.0000 | Freq: Two times a day (BID) | NASAL | 0 refills | Status: AC | PRN

## 2021-12-26 MED ORDER — OXYCODONE-ACETAMINOPHEN 5-325 MG PO TABS
2.0000 | ORAL_TABLET | Freq: Once | ORAL | Status: AC
  Administered 2021-12-26: 2 via ORAL
  Filled 2021-12-26: qty 2
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
## 2021-12-27 ENCOUNTER — Other Ambulatory Visit: Payer: Self-pay

## 2021-12-29 ENCOUNTER — Emergency Department (HOSPITAL_COMMUNITY): Payer: Medicaid Other

## 2021-12-29 ENCOUNTER — Emergency Department (HOSPITAL_COMMUNITY)
Admission: EM | Admit: 2021-12-29 | Discharge: 2021-12-29 | Disposition: A | Discharge status: Home / Self Care | Payer: Medicaid Other | Attending: Emergency Medicine | Admitting: Emergency Medicine

## 2021-12-29 ENCOUNTER — Other Ambulatory Visit: Payer: Self-pay

## 2021-12-29 DIAGNOSIS — X58XXXA Exposure to other specified factors, initial encounter: Secondary | ICD-10-CM | POA: Insufficient documentation

## 2021-12-29 DIAGNOSIS — S060XAD Concussion with loss of consciousness status unknown, subsequent encounter: Secondary | ICD-10-CM | POA: Diagnosis not present

## 2021-12-29 DIAGNOSIS — S0990XA Unspecified injury of head, initial encounter: Secondary | ICD-10-CM | POA: Diagnosis present

## 2021-12-29 LAB — CBC WITH DIFFERENTIAL/PLATELET
Abs Immature Granulocytes: 0.03 10*3/uL (ref 0.00–0.07)
Basophils Absolute: 0 10*3/uL (ref 0.0–0.1)
Basophils Relative: 0 %
Eosinophils Absolute: 0 10*3/uL (ref 0.0–0.5)
Eosinophils Relative: 0 %
HCT: 30.9 % — ABNORMAL LOW (ref 36.0–46.0)
Hemoglobin: 10.4 g/dL — ABNORMAL LOW (ref 12.0–15.0)
Immature Granulocytes: 1 %
Lymphocytes Relative: 12 %
Lymphs Abs: 0.6 10*3/uL — ABNORMAL LOW (ref 0.7–4.0)
MCH: 30.7 pg (ref 26.0–34.0)
MCHC: 33.7 g/dL (ref 30.0–36.0)
MCV: 91.2 fL (ref 80.0–100.0)
Monocytes Absolute: 0.5 10*3/uL (ref 0.1–1.0)
Monocytes Relative: 11 %
Neutro Abs: 3.5 10*3/uL (ref 1.7–7.7)
Neutrophils Relative %: 76 %
Platelets: 146 10*3/uL — ABNORMAL LOW (ref 150–400)
RBC: 3.39 MIL/uL — ABNORMAL LOW (ref 3.87–5.11)
RDW: 11.6 % (ref 11.5–15.5)
WBC: 4.6 10*3/uL (ref 4.0–10.5)
nRBC: 0 % (ref 0.0–0.2)

## 2021-12-29 LAB — BASIC METABOLIC PANEL
Anion gap: 1 — ABNORMAL LOW (ref 5–15)
Anion gap: 3 — ABNORMAL LOW (ref 5–15)
BUN: 7 mg/dL (ref 6–20)
BUN: 8 mg/dL (ref 6–20)
CO2: 21 mmol/L — ABNORMAL LOW (ref 22–32)
CO2: 26 mmol/L (ref 22–32)
Calcium: 6.6 mg/dL — ABNORMAL LOW (ref 8.9–10.3)
Calcium: 8.5 mg/dL — ABNORMAL LOW (ref 8.9–10.3)
Chloride: 117 mmol/L — ABNORMAL HIGH (ref 98–111)
Chloride: 109 mmol/L (ref 98–111)
Creatinine, Ser: 0.52 mg/dL (ref 0.44–1.00)
Creatinine, Ser: 0.6 mg/dL (ref 0.44–1.00)
GFR, Estimated: >60 mL/min (ref >60)
GFR, Estimated: >60 mL/min (ref >60)
Glucose, Bld: 96 mg/dL (ref 70–99)
Glucose, Bld: 110 mg/dL — ABNORMAL HIGH (ref 70–99)
Potassium: 2.9 mmol/L — ABNORMAL LOW (ref 3.5–5.1)
Potassium: 4 mmol/L (ref 3.5–5.1)
Sodium: 139 mmol/L (ref 135–145)
Sodium: 138 mmol/L (ref 135–145)

## 2021-12-29 LAB — I-STAT BETA HCG BLOOD, ED (MC, WL, AP ONLY): I-stat hCG, quantitative: <5 m[IU]/mL (ref <5)

## 2021-12-29 MED ORDER — CALCIUM GLUCONATE-NACL 1-0.675 GM/50ML-% IV SOLN
1.0000 g | Freq: Once | INTRAVENOUS | Status: DC
  Filled 2021-12-29: qty 50

## 2021-12-29 MED ORDER — ONDANSETRON HCL 4 MG PO TABS: 4.0000 mg | ORAL_TABLET | Freq: Four times a day (QID) | ORAL | 0 refills | Status: DC

## 2021-12-29 MED ORDER — SODIUM CHLORIDE 0.9 % IV BOLUS
1000.0000 mL | Freq: Once | INTRAVENOUS | Status: AC
  Administered 2021-12-29: 1000 mL via INTRAVENOUS

## 2021-12-29 MED ORDER — ONDANSETRON 8 MG PO TBDP
8.0000 mg | ORAL_TABLET | Freq: Once | ORAL | Status: AC
  Administered 2021-12-29: 8 mg via ORAL
  Filled 2021-12-29: qty 1
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
## 2022-01-28 ENCOUNTER — Other Ambulatory Visit: Payer: Self-pay

## 2022-01-28 ENCOUNTER — Emergency Department (HOSPITAL_BASED_OUTPATIENT_CLINIC_OR_DEPARTMENT_OTHER)
Admission: EM | Admit: 2022-01-28 | Discharge: 2022-01-28 | Disposition: A | Discharge status: Home / Self Care | Payer: Medicaid Other | Attending: Emergency Medicine | Admitting: Emergency Medicine

## 2022-01-28 ENCOUNTER — Encounter (HOSPITAL_BASED_OUTPATIENT_CLINIC_OR_DEPARTMENT_OTHER): Payer: Self-pay | Admitting: Emergency Medicine

## 2022-01-28 DIAGNOSIS — J45909 Unspecified asthma, uncomplicated: Secondary | ICD-10-CM | POA: Diagnosis not present

## 2022-01-28 DIAGNOSIS — R55 Syncope and collapse: Secondary | ICD-10-CM | POA: Insufficient documentation

## 2022-01-28 DIAGNOSIS — R112 Nausea with vomiting, unspecified: Secondary | ICD-10-CM | POA: Diagnosis present

## 2022-01-28 DIAGNOSIS — Z7951 Long term (current) use of inhaled steroids: Secondary | ICD-10-CM | POA: Diagnosis not present

## 2022-01-28 DIAGNOSIS — R197 Diarrhea, unspecified: Secondary | ICD-10-CM | POA: Insufficient documentation

## 2022-01-28 DIAGNOSIS — Z1152 Encounter for screening for COVID-19: Secondary | ICD-10-CM | POA: Insufficient documentation

## 2022-01-28 DIAGNOSIS — R000 Tachycardia, unspecified: Secondary | ICD-10-CM | POA: Insufficient documentation

## 2022-01-28 LAB — CBC
HCT: 38.2 % (ref 36.0–46.0)
Hemoglobin: 13.1 g/dL (ref 12.0–15.0)
MCH: 30.4 pg (ref 26.0–34.0)
MCHC: 34.3 g/dL (ref 30.0–36.0)
MCV: 88.6 fL (ref 80.0–100.0)
Platelets: 142 10*3/uL — ABNORMAL LOW (ref 150–400)
RBC: 4.31 MIL/uL (ref 3.87–5.11)
RDW: 12 % (ref 11.5–15.5)
WBC: 5.6 10*3/uL (ref 4.0–10.5)
nRBC: 0 % (ref 0.0–0.2)

## 2022-01-28 LAB — URINALYSIS, ROUTINE W REFLEX MICROSCOPIC
Bilirubin Urine: NEGATIVE
Glucose, UA: NEGATIVE mg/dL
Hgb urine dipstick: NEGATIVE
Ketones, ur: 40 mg/dL — AB
Nitrite: NEGATIVE
Protein, ur: NEGATIVE mg/dL
Specific Gravity, Urine: 1.025 (ref 1.005–1.030)
pH: 5.5 (ref 5.0–8.0)

## 2022-01-28 LAB — URINALYSIS, MICROSCOPIC (REFLEX)

## 2022-01-28 LAB — COMPREHENSIVE METABOLIC PANEL
ALT: 14 U/L (ref 0–44)
AST: 16 U/L (ref 15–41)
Albumin: 3.9 g/dL (ref 3.5–5.0)
Alkaline Phosphatase: 56 U/L (ref 38–126)
Anion gap: 8 (ref 5–15)
BUN: 11 mg/dL (ref 6–20)
CO2: 22 mmol/L (ref 22–32)
Calcium: 8.6 mg/dL — ABNORMAL LOW (ref 8.9–10.3)
Chloride: 107 mmol/L (ref 98–111)
Creatinine, Ser: 0.67 mg/dL (ref 0.44–1.00)
GFR, Estimated: >60 mL/min (ref >60)
Glucose, Bld: 104 mg/dL — ABNORMAL HIGH (ref 70–99)
Potassium: 3.9 mmol/L (ref 3.5–5.1)
Sodium: 137 mmol/L (ref 135–145)
Total Bilirubin: 1 mg/dL (ref 0.3–1.2)
Total Protein: 7.3 g/dL (ref 6.5–8.1)

## 2022-01-28 LAB — LIPASE, BLOOD: Lipase: 35 U/L (ref 11–51)

## 2022-01-28 LAB — RESP PANEL BY RT-PCR (RSV, FLU A&B, COVID)  RVPGX2
Influenza A by PCR: NEGATIVE
Influenza B by PCR: NEGATIVE
Resp Syncytial Virus by PCR: NEGATIVE
SARS Coronavirus 2 by RT PCR: NEGATIVE

## 2022-01-28 LAB — PREGNANCY, URINE: Preg Test, Ur: NEGATIVE

## 2022-01-28 MED ORDER — ACETAMINOPHEN 500 MG PO TABS
1000.0000 mg | ORAL_TABLET | Freq: Once | ORAL | Status: AC
  Administered 2022-01-28: 1000 mg via ORAL
  Filled 2022-01-28: qty 2

## 2022-01-28 MED ORDER — KETOROLAC TROMETHAMINE 15 MG/ML IJ SOLN
15.0000 mg | Freq: Once | INTRAMUSCULAR | Status: AC
  Administered 2022-01-28: 15 mg via INTRAVENOUS
  Filled 2022-01-28: qty 1

## 2022-01-28 MED ORDER — LACTATED RINGERS IV BOLUS
1000.0000 mL | Freq: Once | INTRAVENOUS | Status: AC
  Administered 2022-01-28: 1000 mL via INTRAVENOUS

## 2022-01-28 MED ORDER — ONDANSETRON 4 MG PO TBDP
4.0000 mg | ORAL_TABLET | Freq: Once | ORAL | Status: AC | PRN
  Administered 2022-01-28: 4 mg via ORAL
  Filled 2022-01-28: qty 1

## 2022-01-28 MED ORDER — ONDANSETRON HCL 4 MG PO TABS: 4.0000 mg | ORAL_TABLET | Freq: Four times a day (QID) | ORAL | 0 refills | Status: AC

## 2022-01-28 NOTE — Discharge Instructions (Addendum)
Thank you for coming to [facility]. You were seen for nausea/vomiting, an episode of passing out. We did an exam, labs, and imaging, and these showed no acute findings. You likely have  viral infection causing your symptoms. You likely passed out because you were dehydrated. You received nausea medication and a liter of fluids here and felt improved. Please stay well hydrated at home. Please take zofran as needed every 6-8 hours for nausea.  Please follow up with your primary care provider within 1-2 weeks. You can call the health connect number to establish with a primary care doctor.  Do not hesitate to return to the ED or call 911 if you experience: -Worsening symptoms -Lightheadedness, passing out -Fevers/chills -Anything else that concerns you

## 2022-01-28 NOTE — ED Triage Notes (Signed)
Pt reports NVD that started today; HA x 3 d; felt near syncopal in the shower today

## 2022-01-28 NOTE — ED Notes (Signed)
Per provider okay for pt to eat and drink. Pt already had ginger ale in the room on previous shift. Pt states she is feeling much better.

## 2022-01-28 NOTE — ED Provider Notes (Signed)
[facility] EMERGENCY DEPARTMENT Provider Note   CSN: [id number] Arrival date & time: 01/28/22  1443     History {Add pertinent medical, surgical, social history, OB history to HPI:1} Chief Complaint  Patient presents with  . Emesis    Kathy Yang is a 21 y.o. female with asthma, seizures presents with nausea/vomiting diarrhea.   Pt reports NVD that started today; HA x 3 days; felt near syncopal in the shower today which is what brought her in. She did not lose consciousness or hit her head. HA is all over, mild, not thunderclap in nature, not associated with blurry vision/double vision, neck stiffness, trouble speaking/swallowing, numbness/tingling/asymmetric weakness. She has NBNB emesis with NB diarrhea, no abdominal pain, chest pain, SOB, urinary or vaginal symptoms.      Emesis      Home Medications Prior to Admission medications   Medication Sig Start Date End Date Taking? Authorizing Provider  methocarbamol (ROBAXIN) 500 MG tablet Take 1 tablet (500 mg total) by mouth 2 (two) times daily. 03/14/21   Cristopher Peru, PA-C  ondansetron (ZOFRAN) 4 MG tablet Take 1 tablet (4 mg total) by mouth every 6 (six) hours. 12/29/21   Curatolo, Adam, DO  oxymetazoline (AFRIN NASAL SPRAY) 0.05 % nasal spray Place 1 spray into both nostrils 2 (two) times daily as needed (nosebleed). 12/26/21   Mesner, Barbara Cower, MD  promethazine (PHENERGAN) 12.5 MG tablet Take 1 tablet (12.5 mg total) by mouth every 6 (six) hours as needed for nausea or vomiting. 05/27/20   McDonald, Mia A, PA-C  albuterol (PROVENTIL HFA;VENTOLIN HFA) 108 (90 Base) MCG/ACT inhaler Inhale 2 puffs into the lungs every 4 (four) hours as needed for wheezing or shortness of breath. Patient not taking: Reported on 05/27/2020 01/30/18 05/27/20  McDonald, Pedro Earls A, PA-C      Allergies    Patient has no known allergies.    Review of Systems   Review of Systems  Gastrointestinal:  Positive for vomiting.   Review of  systems {pos/neg:18640::"Negative","Positive"} for ***.  A 10 point review of systems was performed and is negative unless otherwise reported in HPI.  Physical Exam Updated Vital Signs BP (!) 96/56   Pulse (!) 106   Temp 98.2 F (36.8 C) (Oral)   Resp 16   Ht 5\' 3"  (1.6 m)   Wt 54.4 kg   LMP 01/05/2022   SpO2 99%   BMI 21.26 kg/m  Physical Exam General: Normal appearing female, lying in bed.  HEENT: PERRLA, Sclera anicteric, MMM, trachea midline.  Cardiology: RRR, no murmurs/rubs/gallops. BL radial and DP pulses equal bilaterally.  Resp: Normal respiratory rate and effort. CTAB, no wheezes, rhonchi, crackles.  Abd: Soft, non-tender, non-distended. No rebound tenderness or guarding.  GU: Deferred. MSK: No peripheral edema or signs of trauma. Extremities without deformity or TTP. No cyanosis or clubbing. Skin: warm, dry. No rashes or lesions. Back: No CVA tenderness Neuro: A&Ox4, CNs II-XII grossly intact. MAEs. Sensation grossly intact.  Psych: Normal mood and affect.   ED Results / Procedures / Treatments   Labs (all labs ordered are listed, but only abnormal results are displayed) Labs Reviewed  RESP PANEL BY RT-PCR (RSV, FLU A&B, COVID)  [id number]  LIPASE, BLOOD  COMPREHENSIVE METABOLIC PANEL  CBC  URINALYSIS, ROUTINE W REFLEX MICROSCOPIC  PREGNANCY, URINE    EKG EKG Interpretation  Date/Time:  Saturday January 28 2022 17:54:46 EST Ventricular Rate:  88 PR Interval:  116 QRS Duration: 82 QT Interval:  354 QTC Calculation:  429 R Axis:   74 Text Interpretation: Sinus rhythm Borderline short PR interval RSR' in V1 or V2, probably normal variant Minimal ST depression, inferior leads Confirmed by Vivi Barrack [phone]) on 01/28/2022 6:19:37 PM  Radiology No results found.  Procedures Procedures  {Document cardiac monitor, telemetry assessment procedure when appropriate:1}  Medications Ordered in ED Medications  ondansetron (ZOFRAN-ODT) disintegrating tablet 4 mg  (4 mg Oral Given 01/28/22 1739)  lactated ringers bolus 1,000 mL (0 mLs Intravenous Stopped 01/28/22 1900)  acetaminophen (TYLENOL) tablet 1,000 mg (1,000 mg Oral Given 01/28/22 2006)  ketorolac (TORADOL) 15 MG/ML injection 15 mg (15 mg Intravenous Given 01/28/22 2006)    ED Course/ Medical Decision Making/ A&P                          Medical Decision Making Amount and/or Complexity of Data Reviewed Labs: ordered. Decision-making details documented in ED Course.  Risk OTC drugs. Prescription drug management.    This patient presents to the ED for concern of N/V/D, HA, presyncope; this involves an extensive number of treatment options, and is a complaint that carries with it a high risk of complications and morbidity.  I considered the following differential and admission for this acute, potentially life threatening condition.   MDM:    DDX for syncope includes but is not limited to:  Highest concern for volume depletion d/t GI losses in s/o possible viral syndrome, considering N/V/D a/w headache. Patient is also mildly tachycardic. Will replete fluids and give zofran and reevaluate. Will also treat her headache w/ tylenol/ibuprofen. For her N/V, consider pancreatitis, pregnancy, electrolyte derangements, renal injury, UTI though no urinary symptoms. ***  Consider anemia and electrolyte abnormalities as possible etiologies. Consider arrhythmias and ACS, although without associated CP/SOB, less likely. Consider seizure given patient's history but no LOC or seizure-like activity reported. Low suspicion for HF, stroke (no focal neuro deficits), HOCM (no murmur, family history of sudden death), ACS or aortic dissection (no chest pain), or GI bleed (stable hgb). Low suspicion for PE given absence of chest pain or dyspnea, no evidence of DVT, no recent surgery/immobilization. Considered arrhythmia but patient's EKG here similar to prior and telemetry shows ST that slows to NSR after fluids,  patient without palpitations.     Clinical Course as of 02/20/22 1915  Sat Jan 28, 2022  1725 Resp panel by RT-PCR (RSV, Flu A&B, Covid) Anterior Nasal Swab Neg [HN]  1914 Pulse Rate: 93 Improved after fluids [HN]  1914 CBC, CMP, lipase reassuring. Covid/flu/rsv neg. [HN]  1922 Preg Test, Ur: NEGATIVE [HN]  1958 Urinalysis, Routine w reflex microscopic Urine, Clean Catch(!) With sqmous 6-10, few bacteria, WBC 0-5, few leukocytes, 40 ketones. No UTI. Just Ketonuria. [HN]  2100 Patient feels much improved.  States her headache is gone and her belly pain is significantly improved.  She is vitally stable and does not feel nauseated anymore.  Patient's orthostatic vitals were also normal and she has no lightheadedness.  Patient's syncopal episode this morning likely due to dehydration in setting of her GI symptoms.  Her EKG is normal and she has no left abnormalities, renal injury, anemia, or leukocytosis on labs.  Lipase is normal and pregnancy test is negative as well.  Patient likely with a viral illness.  Patient be discharged with Zofran prescription and instructions stay well-hydrated.  Instructed to follow-up with PCP in 1 to 2 weeks.  Patient reports understanding, given discharge instructions and return precautions, all  questions answered to patient satisfaction. [HN]    Clinical Course User Index [HN] Loetta Rough, MD     Labs: I Ordered, and personally interpreted labs.  The pertinent results include:  ***  Imaging Studies ordered: I ordered imaging studies including *** I independently visualized and interpreted imaging. I agree with the radiologist interpretation  Additional history obtained from ***.  External records from outside source obtained and reviewed including ***  Cardiac Monitoring: .The patient was maintained on a cardiac monitor.  I personally viewed and interpreted the cardiac monitored which showed an underlying rhythm of: ***  Reevaluation: After the  interventions noted above, I reevaluated the patient and found that they have :{resolved/improved/worsened:23923::"improved"}  Social Determinants of Health: .***  Disposition:  ***  Co morbidities that complicate the patient evaluation . Past Medical History:  Diagnosis Date  . Asthma   . Gastritis   . Seizures (HCC)      Medicines Meds ordered this encounter  Medications  . ondansetron (ZOFRAN-ODT) disintegrating tablet 4 mg    I have reviewed the patients home medicines and have made adjustments as needed  Problem List / ED Course: Problem List Items Addressed This Visit   None        Clinical Course as of 02/20/22 1901  Sat Jan 28, 2022  1725 Resp panel by RT-PCR (RSV, Flu A&B, Covid) Anterior Nasal Swab Neg [HN]  1914 Pulse Rate: 93 Improved after fluids [HN]  1914 CBC, CMP, lipase reassuring. Covid/flu/rsv neg. [HN]  1922 Preg Test, Ur: NEGATIVE [HN]  1958 Urinalysis, Routine w reflex microscopic Urine, Clean Catch(!) With sqmous 6-10, few bacteria, WBC 0-5, few leukocytes, 40 ketones. No UTI. Just Ketonuria. [HN]  2100 Patient feels much improved.  States her headache is gone and her belly pain is significantly improved.  She is vitally stable and does not feel nauseated anymore.  Patient's orthostatic vitals were also normal and she has no lightheadedness.  Patient's syncopal episode this morning likely due to dehydration in setting of her GI symptoms.  Her EKG is normal and she has no left abnormalities, renal injury, anemia, or leukocytosis on labs.  Lipase is normal and pregnancy test is negative as well.  Patient likely with a viral illness.  Patient be discharged with Zofran prescription and instructions stay well-hydrated.  Instructed to follow-up with PCP in 1 to 2 weeks.  Patient reports understanding, given discharge instructions and return precautions, all questions answered to patient satisfaction. [HN]    Clinical Course User Index [HN] Loetta Rough, MD    {Document critical care time when appropriate:1} {Document review of labs and clinical decision tools ie heart score, Chads2Vasc2 etc:1}  {Document your independent review of radiology images, and any outside records:1} {Document your discussion with family members, caretakers, and with consultants:1} {Document social determinants of health affecting pt's care:1} {Document your decision making why or why not admission, treatments were needed:1}  This note was created using dictation software, which may contain spelling or grammatical errors.

## 2022-01-28 NOTE — ED Notes (Signed)
Unsuccessful IV attempt, LAC, Pt tolerated well. Labs collected

## 2022-05-15 ENCOUNTER — Other Ambulatory Visit: Payer: Self-pay

## 2022-05-15 ENCOUNTER — Emergency Department (HOSPITAL_BASED_OUTPATIENT_CLINIC_OR_DEPARTMENT_OTHER)
Admission: EM | Admit: 2022-05-15 | Discharge: 2022-05-15 | Disposition: A | Discharge status: Home / Self Care | Payer: Medicaid Other | Attending: Emergency Medicine | Admitting: Emergency Medicine

## 2022-05-15 ENCOUNTER — Encounter (HOSPITAL_BASED_OUTPATIENT_CLINIC_OR_DEPARTMENT_OTHER): Payer: Self-pay | Admitting: Emergency Medicine

## 2022-05-15 DIAGNOSIS — A084 Viral intestinal infection, unspecified: Secondary | ICD-10-CM | POA: Insufficient documentation

## 2022-05-15 DIAGNOSIS — Z1152 Encounter for screening for COVID-19: Secondary | ICD-10-CM | POA: Insufficient documentation

## 2022-05-15 LAB — COMPREHENSIVE METABOLIC PANEL
ALT: 17 U/L (ref 0–44)
AST: 19 U/L (ref 15–41)
Albumin: 4.6 g/dL (ref 3.5–5.0)
Alkaline Phosphatase: 57 U/L (ref 38–126)
Anion gap: 9 (ref 5–15)
BUN: 8 mg/dL (ref 6–20)
CO2: 22 mmol/L (ref 22–32)
Calcium: 9.1 mg/dL (ref 8.9–10.3)
Chloride: 104 mmol/L (ref 98–111)
Creatinine, Ser: 0.67 mg/dL (ref 0.44–1.00)
GFR, Estimated: >60 mL/min (ref >60)
Glucose, Bld: 102 mg/dL — ABNORMAL HIGH (ref 70–99)
Potassium: 4.5 mmol/L (ref 3.5–5.1)
Sodium: 135 mmol/L (ref 135–145)
Total Bilirubin: 1.1 mg/dL (ref 0.3–1.2)
Total Protein: 8.3 g/dL — ABNORMAL HIGH (ref 6.5–8.1)

## 2022-05-15 LAB — CBC WITH DIFFERENTIAL/PLATELET
Abs Immature Granulocytes: 0.01 10*3/uL (ref 0.00–0.07)
Basophils Absolute: 0 10*3/uL (ref 0.0–0.1)
Basophils Relative: 0 %
Eosinophils Absolute: 0 10*3/uL (ref 0.0–0.5)
Eosinophils Relative: 1 %
HCT: 41.3 % (ref 36.0–46.0)
Hemoglobin: 14.2 g/dL (ref 12.0–15.0)
Immature Granulocytes: 0 %
Lymphocytes Relative: 9 %
Lymphs Abs: 0.5 10*3/uL — ABNORMAL LOW (ref 0.7–4.0)
MCH: 30.1 pg (ref 26.0–34.0)
MCHC: 34.4 g/dL (ref 30.0–36.0)
MCV: 87.7 fL (ref 80.0–100.0)
Monocytes Absolute: 0.4 10*3/uL (ref 0.1–1.0)
Monocytes Relative: 6 %
Neutro Abs: 5.1 10*3/uL (ref 1.7–7.7)
Neutrophils Relative %: 84 %
Platelets: 241 10*3/uL (ref 150–400)
RBC: 4.71 MIL/uL (ref 3.87–5.11)
RDW: 12.1 % (ref 11.5–15.5)
WBC: 6.1 10*3/uL (ref 4.0–10.5)
nRBC: 0 % (ref 0.0–0.2)

## 2022-05-15 LAB — SARS CORONAVIRUS 2 BY RT PCR: SARS Coronavirus 2 by RT PCR: NEGATIVE

## 2022-05-15 LAB — LIPASE, BLOOD: Lipase: 33 U/L (ref 11–51)

## 2022-05-15 MED ORDER — ONDANSETRON HCL 4 MG/2ML IJ SOLN
4.0000 mg | Freq: Once | INTRAMUSCULAR | Status: AC
  Administered 2022-05-15: 4 mg via INTRAVENOUS
  Filled 2022-05-15: qty 2

## 2022-05-15 MED ORDER — SODIUM CHLORIDE 0.9 % IV BOLUS
1000.0000 mL | Freq: Once | INTRAVENOUS | Status: AC
  Administered 2022-05-15: 1000 mL via INTRAVENOUS

## 2022-05-15 MED ORDER — ONDANSETRON 4 MG PO TBDP: 4.0000 mg | ORAL_TABLET | Freq: Three times a day (TID) | ORAL | 0 refills | Status: DC | PRN

## 2022-05-15 MED ORDER — OMEPRAZOLE 20 MG PO CPDR: 20.0000 mg | DELAYED_RELEASE_CAPSULE | Freq: Every day | ORAL | 1 refills | Status: AC

## 2022-05-15 NOTE — ED Triage Notes (Signed)
Patient presents C/O N/V/D, abdominal pain since last night. States S/S started after eating cookout.

## 2022-05-15 NOTE — ED Notes (Signed)
Discharge paperwork reviewed entirely with patient, including Rx's and follow up care. Pain was under control. Pt verbalized understanding as well as all parties involved. No questions or concerns voiced at the time of discharge. No acute distress noted.   Pt ambulated out to PVA without incident or assistance.  

## 2022-05-15 NOTE — ED Provider Notes (Signed)
[place] EMERGENCY [facility]MENT [facility] Provider Note   CSN: [id number] Arrival date & time: 05/15/22  1140     History  Chief Complaint  Patient presents with   Emesis   Diarrhea    Kathy Yang is a 22 y.o. female presented to ED with nausea, vomiting and diarrhea.  Patient reports onset of symptoms this morning.  She says she ate at[employer] fast food last night, and had a milkshake and fries, no meat.  He says he woke up feeling the symptoms this morning.  Still feels nauseated.  Loose bowel movements.  HPI     Home Medications Prior to Admission medications   Medication Sig Start Date End Date Taking? Authorizing Provider  omeprazole (PRILOSEC) 20 MG capsule Take 1 capsule (20 mg total) by mouth daily. 05/15/22 06/14/22 Yes Sophy Mesler, Carola Rhine, MD  ondansetron (ZOFRAN-ODT) 4 MG disintegrating tablet Take 1 tablet (4 mg total) by mouth every 8 (eight) hours as needed for up to 12 doses for nausea or vomiting. 05/15/22  Yes Delio Slates, Carola Rhine, MD  methocarbamol (ROBAXIN) 500 MG tablet Take 1 tablet (500 mg total) by mouth 2 (two) times daily. 03/14/21   Mickie Hillier, PA-C  ondansetron (ZOFRAN) 4 MG tablet Take 1 tablet (4 mg total) by mouth every 6 (six) hours. 01/28/22   Audley Hose, MD  oxymetazoline (AFRIN NASAL SPRAY) 0.05 % nasal spray Place 1 spray into both nostrils 2 (two) times daily as needed (nosebleed). 12/26/21   Mesner, Corene Cornea, MD  promethazine (PHENERGAN) 12.5 MG tablet Take 1 tablet (12.5 mg total) by mouth every 6 (six) hours as needed for nausea or vomiting. 05/27/20   McDonald, Mia A, PA-C  albuterol (PROVENTIL HFA;VENTOLIN HFA) 108 (90 Base) MCG/ACT inhaler Inhale 2 puffs into the lungs every 4 (four) hours as needed for wheezing or shortness of breath. Patient not taking: Reported on 05/27/2020 01/30/18 05/27/20  McDonald, Maree Erie A, PA-C      Allergies    Patient has no known allergies.    Review of Systems   Review of Systems  Physical  Exam Updated Vital Signs BP 101/69 (BP Location: Right Arm)   Pulse 89   Temp 97.8 F (36.6 C) (Oral)   Resp 16   Ht 5\' 4"  (1.626 m)   Wt 54.4 kg   LMP 05/08/2022 (Approximate)   SpO2 100%   BMI 20.60 kg/m  Physical Exam Constitutional:      General: She is not in acute distress. HENT:     Head: Normocephalic and atraumatic.  Eyes:     Conjunctiva/sclera: Conjunctivae normal.     Pupils: Pupils are equal, round, and reactive to light.  Cardiovascular:     Rate and Rhythm: Normal rate and regular rhythm.  Pulmonary:     Effort: Pulmonary effort is normal. No respiratory distress.  Abdominal:     General: There is no distension.     Tenderness: There is no abdominal tenderness.  Skin:    General: Skin is warm and dry.  Neurological:     General: No focal deficit present.     Mental Status: She is alert. Mental status is at baseline.  Psychiatric:        Mood and Affect: Mood normal.        Behavior: Behavior normal.     ED Results / Procedures / Treatments   Labs (all labs ordered are listed, but only abnormal results are displayed) Labs Reviewed  COMPREHENSIVE METABOLIC PANEL -  Abnormal; Notable for the following components:      Result Value   Glucose, Bld 102 (*)    Total Protein 8.3 (*)    All other components within normal limits  CBC WITH DIFFERENTIAL/PLATELET - Abnormal; Notable for the following components:   Lymphs Abs 0.5 (*)    All other components within normal limits  SARS CORONAVIRUS 2 BY RT PCR  LIPASE, BLOOD    EKG None  Radiology No results found.  Procedures Procedures    Medications Ordered in ED Medications  sodium chloride 0.9 % bolus 1,000 mL (1,000 mLs Intravenous New Bag/Given 05/15/22 1231)  ondansetron (ZOFRAN) injection 4 mg (4 mg Intravenous Given 05/15/22 1229)    ED Course/ Medical Decision Making/ A&P Clinical Course as of 05/15/22 1325  Mon May 15, 2022  1323 Patient is feeling better with her fluids and medication.   She reports that she ran out of her omeprazole which was prescribed by her GI doctor and I will restart this now as well as provide her Zofran which she was on in the past.  I suspect a viral gastroenteritis.  A work note was provided.  She verbalized understanding [MT]    Clinical Course User Index [MT] Naijah Lacek, Carola Rhine, MD                             Medical Decision Making Amount and/or Complexity of Data Reviewed Labs: ordered.  Risk Prescription drug management.   This patient presents to the ED with concern for nausea, vomiting, diarrhea. This involves an extensive number of treatment options, and is a complaint that carries with it a high risk of complications and morbidity.  The differential diagnosis includes viral gastroenteritis versus foodborne illness versus other   I ordered and personally interpreted labs.  The pertinent results include: No emergent findings.  Patient's blood work is unremarkable.  Low suspicion for pregnancy.  The patient was maintained on a cardiac monitor.  I personally viewed and interpreted the cardiac monitored which showed an underlying rhythm of: Sinus rhythm  I ordered medication including IV fluid and Zofran for nausea and hydration  I have reviewed the patients home medicines and have made adjustments as needed  Test Considered: With normal blood work and a reassuring repeat abdominal exam, the lower suspicion for acute appendicitis or pelvic pathology to require ultrasound or CT imaging at this time.   After the interventions noted above, I reevaluated the patient and found that they have: improved  Dispostion:  After consideration of the diagnostic results and the patients response to treatment, I feel that the patent would benefit from outpatient follow-up.         Final Clinical Impression(s) / ED Diagnoses Final diagnoses:  Viral gastroenteritis    Rx / DC Orders ED Discharge Orders          Ordered    ondansetron  (ZOFRAN-ODT) 4 MG disintegrating tablet  Every 8 hours PRN        05/15/22 1323    omeprazole (PRILOSEC) 20 MG capsule  Daily        05/15/22 1323              Wyvonnia Dusky, MD 05/15/22 1325

## 2022-09-14 ENCOUNTER — Other Ambulatory Visit: Payer: Self-pay

## 2022-09-14 ENCOUNTER — Emergency Department (HOSPITAL_BASED_OUTPATIENT_CLINIC_OR_DEPARTMENT_OTHER)
Admission: EM | Admit: 2022-09-14 | Discharge: 2022-09-14 | Disposition: A | Discharge status: Home / Self Care | Payer: Self-pay | Attending: Emergency Medicine | Admitting: Emergency Medicine

## 2022-09-14 ENCOUNTER — Encounter (HOSPITAL_BASED_OUTPATIENT_CLINIC_OR_DEPARTMENT_OTHER): Payer: Self-pay | Admitting: Emergency Medicine

## 2022-09-14 DIAGNOSIS — J45909 Unspecified asthma, uncomplicated: Secondary | ICD-10-CM | POA: Insufficient documentation

## 2022-09-14 DIAGNOSIS — R109 Unspecified abdominal pain: Secondary | ICD-10-CM

## 2022-09-14 DIAGNOSIS — N939 Abnormal uterine and vaginal bleeding, unspecified: Secondary | ICD-10-CM | POA: Insufficient documentation

## 2022-09-14 LAB — COMPREHENSIVE METABOLIC PANEL
ALT: 15 U/L (ref 0–44)
AST: 15 U/L (ref 15–41)
Albumin: 4.2 g/dL (ref 3.5–5.0)
Alkaline Phosphatase: 54 U/L (ref 38–126)
Anion gap: 10 (ref 5–15)
BUN: 7 mg/dL (ref 6–20)
CO2: 25 mmol/L (ref 22–32)
Calcium: 9 mg/dL (ref 8.9–10.3)
Chloride: 103 mmol/L (ref 98–111)
Creatinine, Ser: 0.66 mg/dL (ref 0.44–1.00)
GFR, Estimated: >60 mL/min (ref >60)
Glucose, Bld: 104 mg/dL — ABNORMAL HIGH (ref 70–99)
Potassium: 4 mmol/L (ref 3.5–5.1)
Sodium: 138 mmol/L (ref 135–145)
Total Bilirubin: 0.4 mg/dL (ref 0.3–1.2)
Total Protein: 7.4 g/dL (ref 6.5–8.1)

## 2022-09-14 LAB — CBC
HCT: 36.3 % (ref 36.0–46.0)
Hemoglobin: 12.5 g/dL (ref 12.0–15.0)
MCH: 30.4 pg (ref 26.0–34.0)
MCHC: 34.4 g/dL (ref 30.0–36.0)
MCV: 88.3 fL (ref 80.0–100.0)
Platelets: 212 10*3/uL (ref 150–400)
RBC: 4.11 MIL/uL (ref 3.87–5.11)
RDW: 11.9 % (ref 11.5–15.5)
WBC: 3.2 10*3/uL — ABNORMAL LOW (ref 4.0–10.5)
nRBC: 0 % (ref 0.0–0.2)

## 2022-09-14 LAB — URINALYSIS, MICROSCOPIC (REFLEX)

## 2022-09-14 LAB — URINALYSIS, ROUTINE W REFLEX MICROSCOPIC
Bilirubin Urine: NEGATIVE
Glucose, UA: NEGATIVE mg/dL
Ketones, ur: NEGATIVE mg/dL
Leukocytes,Ua: NEGATIVE
Nitrite: NEGATIVE
Protein, ur: NEGATIVE mg/dL
Specific Gravity, Urine: 1.015 (ref 1.005–1.030)
pH: 7.5 (ref 5.0–8.0)

## 2022-09-14 LAB — LIPASE, BLOOD: Lipase: 41 U/L (ref 11–51)

## 2022-09-14 LAB — PREGNANCY, URINE: Preg Test, Ur: NEGATIVE

## 2022-09-14 MED ORDER — ALUM & MAG HYDROXIDE-SIMETH 200-200-20 MG/5ML PO SUSP
30.0000 mL | Freq: Once | ORAL | Status: AC
  Administered 2022-09-14: 30 mL via ORAL
  Filled 2022-09-14: qty 30

## 2022-09-14 MED ORDER — CEPHALEXIN 500 MG PO CAPS: 500.0000 mg | ORAL_CAPSULE | Freq: Three times a day (TID) | ORAL | 0 refills | Status: AC

## 2022-09-14 MED ORDER — LIDOCAINE VISCOUS HCL 2 % MT SOLN
15.0000 mL | Freq: Once | OROMUCOSAL | Status: AC
  Administered 2022-09-14: 15 mL via ORAL
  Filled 2022-09-14: qty 15

## 2022-09-14 MED ORDER — SODIUM CHLORIDE 0.9 % IV BOLUS
1000.0000 mL | Freq: Once | INTRAVENOUS | Status: AC
  Administered 2022-09-14: 1000 mL via INTRAVENOUS

## 2022-09-14 MED ORDER — ONDANSETRON HCL 4 MG/2ML IJ SOLN
4.0000 mg | Freq: Once | INTRAMUSCULAR | Status: AC
  Administered 2022-09-14: 4 mg via INTRAVENOUS
  Filled 2022-09-14: qty 2

## 2022-09-14 MED ORDER — PRENATAL COMPLETE 14-0.4 MG PO TABS: 1.0000 | ORAL_TABLET | Freq: Every day | ORAL | 0 refills | Status: AC

## 2022-09-14 NOTE — ED Notes (Signed)
Patient drank only part of her maalox and lidocaine then refused the rest.

## 2022-09-14 NOTE — ED Notes (Signed)
Passed Po challenge of ginger ale and crackers.

## 2022-09-14 NOTE — ED Provider Notes (Signed)
[place] [facility]GENCY [facility]PARTMENT AT [facility] Provider Note  CSN: [id number] Arrival date & time: 09/14/22 1308  Chief Complaint(s) Vaginal Bleeding  HPI Kathy Yang is a 22 y.o. female with past medical history as below, significant for asthma, gastritis, seizures who presents to the ED with complaint of vaginal bleeding/cramping/n/v.   Pt with epigastric burning/ pelvic cramping/burning, some flank cramping as well over last 2 weeks. She had unprotected sexual intercourse around 2 wks ago and her period was around 6-7 days late but she is currently spotting. No dysuria or urgency, no abnormal vaginal discharge, no melena or BRBPR, no suspicious PO intake. No medications PTA for her symptoms. Reports her periods are typically very regular. She is not currently concerned for STI. No prior abd surgery. Nausea and vomiting is intermittent.   Past Medical History Past Medical History:  Diagnosis Date   Asthma    Gastritis    Seizures (HCC)    There are no problems to display for this patient.  Home Medication(s) Prior to Admission medications   Medication Sig Start Date End Date Taking? Authorizing Provider  cephALEXin (KEFLEX) 500 MG capsule Take 1 capsule (500 mg total) by mouth 3 (three) times daily for 7 days. 09/14/22 09/21/22 Yes Sloan Leiter, DO  Prenatal Vit-Fe Fumarate-FA (PRENATAL COMPLETE) 14-0.4 MG TABS Take 1 tablet by mouth daily. 09/14/22  Yes Sloan Leiter, DO  omeprazole (PRILOSEC) 20 MG capsule Take 1 capsule (20 mg total) by mouth daily. 05/15/22 06/14/22  Terald Sleeper, MD  ondansetron (ZOFRAN) 4 MG tablet Take 1 tablet (4 mg total) by mouth every 6 (six) hours. 01/28/22   Loetta Rough, MD  ondansetron (ZOFRAN-ODT) 4 MG disintegrating tablet Take 1 tablet (4 mg total) by mouth every 8 (eight) hours as needed for up to 12 doses for nausea or vomiting. 05/15/22   Terald Sleeper, MD  oxymetazoline (AFRIN NASAL SPRAY) 0.05 % nasal spray Place 1 spray into both  nostrils 2 (two) times daily as needed (nosebleed). 12/26/21   Mesner, Barbara Cower, MD  promethazine (PHENERGAN) 12.5 MG tablet Take 1 tablet (12.5 mg total) by mouth every 6 (six) hours as needed for nausea or vomiting. 05/27/20   McDonald, Mia A, PA-C  albuterol (PROVENTIL HFA;VENTOLIN HFA) 108 (90 Base) MCG/ACT inhaler Inhale 2 puffs into the lungs every 4 (four) hours as needed for wheezing or shortness of breath. Patient not taking: Reported on 05/27/2020 01/30/18 05/27/20  Barkley Boards, PA-C                                                                                                                                    Past Surgical History Past Surgical History:  Procedure Laterality Date   SURGERY OF LIP     Family History Family History  Problem Relation Age of Onset   Healthy Mother     Social History Social History  Tobacco Use   Smoking status: Never   Smokeless tobacco: Never  Vaping Use   Vaping status: Never Used  Substance Use Topics   Alcohol use: No   Drug use: No   Allergies Patient has no known allergies.  Review of Systems Review of Systems  Constitutional:  Negative for chills and fever.  HENT:  Negative for congestion and rhinorrhea.   Respiratory:  Negative for chest tightness and shortness of breath.   Cardiovascular:  Negative for chest pain and palpitations.  Gastrointestinal:  Positive for abdominal pain, nausea and vomiting. Negative for blood in stool and diarrhea.  Genitourinary:  Positive for pelvic pain. Negative for difficulty urinating and dysuria.  Musculoskeletal:  Negative for neck pain and neck stiffness.  All other systems reviewed and are negative.   Physical Exam Vital Signs  I have reviewed the triage vital signs BP 113/87   Pulse 86   Temp 98.1 F (36.7 C) (Oral)   Resp 17   LMP 08/15/2022   SpO2 100%  Physical Exam Vitals and nursing note reviewed.  Constitutional:      General: She is not in acute distress.     Appearance: Normal appearance.  HENT:     Head: Normocephalic and atraumatic.     Right Ear: External ear normal.     Left Ear: External ear normal.     Nose: Nose normal.     Mouth/Throat:     Mouth: Mucous membranes are moist.  Eyes:     General: No scleral icterus.       Right eye: No discharge.        Left eye: No discharge.  Cardiovascular:     Rate and Rhythm: Normal rate and regular rhythm.     Pulses: Normal pulses.     Heart sounds: Normal heart sounds.  Pulmonary:     Effort: Pulmonary effort is normal. No respiratory distress.     Breath sounds: Normal breath sounds. No stridor.  Abdominal:     General: Abdomen is flat. There is no distension.     Palpations: Abdomen is soft.     Tenderness: There is abdominal tenderness. There is no guarding or rebound.       Comments: Non peritoneal abdomen   Musculoskeletal:     Cervical back: No rigidity.     Right lower leg: No edema.     Left lower leg: No edema.  Skin:    General: Skin is warm and dry.     Capillary Refill: Capillary refill takes less than 2 seconds.  Neurological:     Mental Status: She is alert.  Psychiatric:        Mood and Affect: Mood normal.        Behavior: Behavior normal. Behavior is cooperative.     ED Results and Treatments Labs (all labs ordered are listed, but only abnormal results are displayed) Labs Reviewed  CBC - Abnormal; Notable for the following components:      Result Value   WBC 3.2 (*)    All other components within normal limits  URINALYSIS, ROUTINE W REFLEX MICROSCOPIC - Abnormal; Notable for the following components:   APPearance CLOUDY (*)    Hgb urine dipstick LARGE (*)    All other components within normal limits  COMPREHENSIVE METABOLIC PANEL - Abnormal; Notable for the following components:   Glucose, Bld 104 (*)    All other components within normal limits  URINALYSIS, MICROSCOPIC (REFLEX) - Abnormal; Notable for the  following components:   Bacteria, UA RARE  (*)    All other components within normal limits  URINE CULTURE  PREGNANCY, URINE  LIPASE, BLOOD                                                                                                                          Radiology No results found.  Pertinent labs & imaging results that were available during my care of the patient were reviewed by me and considered in my medical decision making (see MDM for details).  Medications Ordered in ED Medications  sodium chloride 0.9 % bolus 1,000 mL (0 mLs Intravenous Stopped 09/14/22 2117)  ondansetron (ZOFRAN) injection 4 mg (4 mg Intravenous Given 09/14/22 1827)  alum & mag hydroxide-simeth (MAALOX/MYLANTA) 200-200-20 MG/5ML suspension 30 mL (30 mLs Oral Given 09/14/22 1827)    And  lidocaine (XYLOCAINE) 2 % viscous mouth solution 15 mL (15 mLs Oral Given 09/14/22 1827)                                                                                                                                     Procedures Procedures  (including critical care time)  Medical Decision Making / ED Course    Medical Decision Making:    Kathy Yang is a 22 y.o. female with past medical history as below, significant for asthma, gastritis, seizures who presents to the ED with complaint of vaginal bleeding/cramping/n/v. Marland Kitchen The complaint involves an extensive differential diagnosis and also carries with it a high risk of complications and morbidity.  Serious etiology was considered. Ddx includes but is not limited to: Differential diagnosis includes but is not exclusive to acute cholecystitis, intrathoracic causes for epigastric abdominal pain, gastritis, duodenitis, pancreatitis, small bowel or large bowel obstruction, abdominal aortic aneurysm, hernia, gastritis, etc. Differential diagnosis includes but is not exclusive to ectopic pregnancy, ovarian cyst, ovarian torsion, acute appendicitis, urinary tract infection, endometriosis, bowel obstruction, hernia, colitis,  renal colic, gastroenteritis, volvulus etc.   Complete initial physical exam performed, notably the patient  was NAD, sitting upright, HDS.    Reviewed and confirmed nursing documentation for past medical history, family history, social history.  Vital signs reviewed.    Clinical Course as of 09/14/22 2126  Thu Sep 14, 2022  2118 Feeling better on recheck. Able to tolerate PO. Labs stable  [SG]    Clinical Course User Index [SG] Wallace Cullens,  Paul Dykes, DO   Labs reviewed She does have bacteria in urine but no UTI evident. She had negative preg test today but had unprotected intercourse 2 weeks ago. Will send culture and treat as asymptomatic bacteruria given questionable pregnancy status Advised her to f/u with her OBGYN in 2 weeks for recheck Her workup otherwise is stable She is able to tolerate PO w/o difficulty Pain has improved significantly Give stable labs and exam, do not feel imaging is warranted at this time HDS Strict return precautions were emphasized  Parke Simmers diet encouraged   The patient improved significantly and was discharged in stable condition. Detailed discussions were had with the patient regarding current findings, and need for close f/u with PCP or on call doctor. The patient has been instructed to return immediately if the symptoms worsen in any way for re-evaluation. Patient verbalized understanding and is in agreement with current care plan. All questions answered prior to discharge.           Additional history obtained: -Additional history obtained from cousin -External records from outside source obtained and reviewed including: Chart review including previous notes, labs, imaging, consultation notes including prior ed visit, prior labs/imaging / home meds    Lab Tests: -I ordered, reviewed, and interpreted labs.   The pertinent results include:   Labs Reviewed  CBC - Abnormal; Notable for the following components:      Result Value   WBC 3.2 (*)     All other components within normal limits  URINALYSIS, ROUTINE W REFLEX MICROSCOPIC - Abnormal; Notable for the following components:   APPearance CLOUDY (*)    Hgb urine dipstick LARGE (*)    All other components within normal limits  COMPREHENSIVE METABOLIC PANEL - Abnormal; Notable for the following components:   Glucose, Bld 104 (*)    All other components within normal limits  URINALYSIS, MICROSCOPIC (REFLEX) - Abnormal; Notable for the following components:   Bacteria, UA RARE (*)    All other components within normal limits  URINE CULTURE  PREGNANCY, URINE  LIPASE, BLOOD    Notable for labs stable  EKG   EKG Interpretation Date/Time:    Ventricular Rate:    PR Interval:    QRS Duration:    QT Interval:    QTC Calculation:   R Axis:      Text Interpretation:           Imaging Studies ordered: na  Medicines ordered and prescription drug management: Meds ordered this encounter  Medications   sodium chloride 0.9 % bolus 1,000 mL   ondansetron (ZOFRAN) injection 4 mg   AND Linked Order Group    alum & mag hydroxide-simeth (MAALOX/MYLANTA) 200-200-20 MG/5ML suspension 30 mL    lidocaine (XYLOCAINE) 2 % viscous mouth solution 15 mL   cephALEXin (KEFLEX) 500 MG capsule    Sig: Take 1 capsule (500 mg total) by mouth 3 (three) times daily for 7 days.    Dispense:  20 capsule    Refill:  0   Prenatal Vit-Fe Fumarate-FA (PRENATAL COMPLETE) 14-0.4 MG TABS    Sig: Take 1 tablet by mouth daily.    Dispense:  30 tablet    Refill:  0    -I have reviewed the patients home medicines and have made adjustments as needed   Consultations Obtained: na   Cardiac Monitoring: Continuous pulse oximetry interpreted by myself 99-100% on RA  Social Determinants of Health:  Diagnosis or treatment significantly limited by social determinants of  health: no pcp   Reevaluation: After the interventions noted above, I reevaluated the patient and found that they have  improved  Co morbidities that complicate the patient evaluation  Past Medical History:  Diagnosis Date   Asthma    Gastritis    Seizures (HCC)       Dispostion: Disposition decision including need for hospitalization was considered, and patient discharged from emergency department.    Final Clinical Impression(s) / ED Diagnoses Final diagnoses:  Abdominal pain, unspecified abdominal location  Vaginal bleeding        Sloan Leiter, DO 09/14/22 2126

## 2022-09-14 NOTE — ED Triage Notes (Signed)
Patient presents to ED via POV from home. Here with vaginal bleeding and abdominal cramping. Patient is pregnant. Unable to state how far along she is.

## 2022-09-14 NOTE — Discharge Instructions (Addendum)
There remains a chance that you may be pregnant given recent sexual activity. Please follow up with your OBGYN in 2 weeks for recheck and repeat pregnancy test. Recommend very bland diet for the next few days as outlined in handout. If your bleeding worsens and you are using >1 pad per hour please return for recheck or if you develop any worsening or worrisome symptoms.   It was a pleasure caring for you today in the emergency department.  Please return to the emergency department for any worsening or worrisome symptoms.

## 2023-05-05 ENCOUNTER — Emergency Department (HOSPITAL_BASED_OUTPATIENT_CLINIC_OR_DEPARTMENT_OTHER)

## 2023-05-05 ENCOUNTER — Other Ambulatory Visit: Payer: Self-pay

## 2023-05-05 ENCOUNTER — Emergency Department (HOSPITAL_BASED_OUTPATIENT_CLINIC_OR_DEPARTMENT_OTHER)
Admission: EM | Admit: 2023-05-05 | Discharge: 2023-05-05 | Disposition: A | Discharge status: Home / Self Care | Attending: Emergency Medicine | Admitting: Emergency Medicine

## 2023-05-05 ENCOUNTER — Encounter (HOSPITAL_BASED_OUTPATIENT_CLINIC_OR_DEPARTMENT_OTHER): Payer: Self-pay | Admitting: Urology

## 2023-05-05 DIAGNOSIS — J45909 Unspecified asthma, uncomplicated: Secondary | ICD-10-CM | POA: Diagnosis not present

## 2023-05-05 DIAGNOSIS — J101 Influenza due to other identified influenza virus with other respiratory manifestations: Secondary | ICD-10-CM | POA: Diagnosis not present

## 2023-05-05 DIAGNOSIS — R059 Cough, unspecified: Secondary | ICD-10-CM | POA: Diagnosis present

## 2023-05-05 LAB — RESP PANEL BY RT-PCR (RSV, FLU A&B, COVID)  RVPGX2
Influenza A by PCR: NEGATIVE
Influenza B by PCR: POSITIVE — AB
Resp Syncytial Virus by PCR: NEGATIVE
SARS Coronavirus 2 by RT PCR: NEGATIVE

## 2023-05-05 MED ORDER — ONDANSETRON 4 MG PO TBDP
4.0000 mg | ORAL_TABLET | Freq: Once | ORAL | Status: AC | PRN
  Administered 2023-05-05: 4 mg via ORAL
  Filled 2023-05-05: qty 1

## 2023-05-05 MED ORDER — ONDANSETRON 4 MG PO TBDP: 4.0000 mg | ORAL_TABLET | Freq: Three times a day (TID) | ORAL | 0 refills | Status: DC | PRN

## 2023-05-05 NOTE — ED Provider Notes (Signed)
 [place] [facility] AT [facility] Provider Note   CSN: [id number] Arrival date & time: 05/05/23  1623     History {Add pertinent medical, surgical, social history, OB history to HPI:1} Chief Complaint  Patient presents with   Flu like symptoms     Kathy Yang is a 23 y.o. female.  HPI     Home Medications Prior to Admission medications   Medication Sig Start Date End Date Taking? Authorizing Provider  omeprazole (PRILOSEC) 20 MG capsule Take 1 capsule (20 mg total) by mouth daily. 05/15/22 06/14/22  Terald Sleeper, MD  ondansetron (ZOFRAN) 4 MG tablet Take 1 tablet (4 mg total) by mouth every 6 (six) hours. 01/28/22   Loetta Rough, MD  ondansetron (ZOFRAN-ODT) 4 MG disintegrating tablet Take 1 tablet (4 mg total) by mouth every 8 (eight) hours as needed for up to 12 doses for nausea or vomiting. 05/15/22   Terald Sleeper, MD  oxymetazoline (AFRIN NASAL SPRAY) 0.05 % nasal spray Place 1 spray into both nostrils 2 (two) times daily as needed (nosebleed). 12/26/21   Mesner, Barbara Cower, MD  Prenatal Vit-Fe Fumarate-FA (PRENATAL COMPLETE) 14-0.4 MG TABS Take 1 tablet by mouth daily. 09/14/22   Sloan Leiter, DO  promethazine (PHENERGAN) 12.5 MG tablet Take 1 tablet (12.5 mg total) by mouth every 6 (six) hours as needed for nausea or vomiting. 05/27/20   McDonald, Mia A, PA-C  albuterol (PROVENTIL HFA;VENTOLIN HFA) 108 (90 Base) MCG/ACT inhaler Inhale 2 puffs into the lungs every 4 (four) hours as needed for wheezing or shortness of breath. Patient not taking: Reported on 05/27/2020 01/30/18 05/27/20  McDonald, Pedro Earls A, PA-C      Allergies    Patient has no known allergies.    Review of Systems   Review of Systems  Physical Exam Updated Vital Signs BP 118/75 (BP Location: Left Arm)   Pulse (!) 110   Temp 98.1 F (36.7 C) (Oral)   Resp 18   Ht 5\' 4"  (1.626 m)   Wt 54.4 kg   LMP 05/05/2023   SpO2 100%   Breastfeeding Unknown   BMI 20.59 kg/m  Physical  Exam  ED Results / Procedures / Treatments   Labs (all labs ordered are listed, but only abnormal results are displayed) Labs Reviewed  RESP PANEL BY RT-PCR (RSV, FLU A&B, COVID)  RVPGX2 - Abnormal; Notable for the following components:      Result Value   Influenza B by PCR POSITIVE (*)    All other components within normal limits    EKG None  Radiology DG Chest 2 View Result Date: 05/05/2023 CLINICAL DATA:  Cough.  Nausea.  Headache. EXAM: CHEST - 2 VIEW COMPARISON:  12/29/2021 FINDINGS: Midline trachea.  Normal heart size and mediastinal contours. Sharp costophrenic angles.  No pneumothorax.  Clear lungs. IMPRESSION: No active cardiopulmonary disease. Electronically Signed   By: Jeronimo Greaves M.D.   On: 05/05/2023 17:20    Procedures Procedures  {Document cardiac monitor, telemetry assessment procedure when appropriate:1}  Medications Ordered in ED Medications - No data to display  ED Course/ Medical Decision Making/ A&P   {   Click here for ABCD2, HEART and other calculatorsREFRESH Note before signing :1}                              Medical Decision Making Amount and/or Complexity of Data Reviewed Radiology: ordered.   ***  {  Document critical care time when appropriate:1} {Document review of labs and clinical decision tools ie heart score, Chads2Vasc2 etc:1}  {Document your independent review of radiology images, and any outside records:1} {Document your discussion with family members, caretakers, and with consultants:1} {Document social determinants of health affecting pt's care:1} {Document your decision making why or why not admission, treatments were needed:1} Final Clinical Impression(s) / ED Diagnoses Final diagnoses:  None    Rx / DC Orders ED Discharge Orders     None

## 2023-05-05 NOTE — ED Notes (Signed)
 Reviewed discharge instructions and follow up with pt. Pt states understanding

## 2023-05-05 NOTE — ED Notes (Signed)
 Vomited x 1 in fastrack. Medicated for nausea prior to discharge

## 2023-05-05 NOTE — ED Triage Notes (Signed)
 Pt states a month ago was admitted with pneumonia and COVID  States chill and hot flashes, Nausea, headache, cough, body aches tat started 3 days ago

## 2023-09-20 ENCOUNTER — Emergency Department (HOSPITAL_BASED_OUTPATIENT_CLINIC_OR_DEPARTMENT_OTHER)

## 2023-09-20 ENCOUNTER — Other Ambulatory Visit: Payer: Self-pay

## 2023-09-20 ENCOUNTER — Emergency Department (HOSPITAL_BASED_OUTPATIENT_CLINIC_OR_DEPARTMENT_OTHER): Admission: EM | Admit: 2023-09-20 | Discharge: 2023-09-20 | Disposition: A | Discharge status: Home / Self Care

## 2023-09-20 ENCOUNTER — Encounter (HOSPITAL_BASED_OUTPATIENT_CLINIC_OR_DEPARTMENT_OTHER): Payer: Self-pay

## 2023-09-20 DIAGNOSIS — S93401A Sprain of unspecified ligament of right ankle, initial encounter: Secondary | ICD-10-CM | POA: Insufficient documentation

## 2023-09-20 DIAGNOSIS — S060X0A Concussion without loss of consciousness, initial encounter: Secondary | ICD-10-CM | POA: Insufficient documentation

## 2023-09-20 DIAGNOSIS — S0990XA Unspecified injury of head, initial encounter: Secondary | ICD-10-CM | POA: Diagnosis present

## 2023-09-20 DIAGNOSIS — W182XXA Fall in (into) shower or empty bathtub, initial encounter: Secondary | ICD-10-CM | POA: Insufficient documentation

## 2023-09-20 LAB — PREGNANCY, URINE: Preg Test, Ur: NEGATIVE

## 2023-09-20 MED ORDER — ONDANSETRON 4 MG PO TBDP
4.0000 mg | ORAL_TABLET | Freq: Once | ORAL | Status: AC
  Administered 2023-09-20: 4 mg via ORAL
  Filled 2023-09-20: qty 1

## 2023-09-20 MED ORDER — IBUPROFEN 400 MG PO TABS
600.0000 mg | ORAL_TABLET | Freq: Once | ORAL | Status: AC
  Administered 2023-09-20: 600 mg via ORAL
  Filled 2023-09-20: qty 1

## 2023-09-20 NOTE — ED Triage Notes (Signed)
 Reports s/p fall today w/head injury, denies blood thinners, c/o RLE pain, swelling noted, A+Ox4, VSS, NAD noted.

## 2023-09-20 NOTE — ED Provider Notes (Signed)
 [facility] Provider Note   CSN: [id number] Arrival date & time: 09/20/23  1739     Patient presents with: [place]   Kathy Yang is a 23 y.o. female who presents with concern for mechanical fall when getting out of the shower today.  States she slipped due to the floor being wet, and fell hitting her head against the counter.  She denies any loss of consciousness.  Does report a headache behind her left forehead where she hit her head.  Also reports 3 episodes of vomiting after this occurred. Denies any vision changes. She is not on any anticoagulation.  Also reporting pain in the right ankle and in the lower back.    Fall Associated symptoms include headaches.       Prior to Admission medications   Medication Sig Start Date End Date Taking? Authorizing Provider  omeprazole  (PRILOSEC) 20 MG capsule Take 1 capsule (20 mg total) by mouth daily. 05/15/22 06/14/22  Cottie Donnice PARAS, MD  ondansetron  (ZOFRAN ) 4 MG tablet Take 1 tablet (4 mg total) by mouth every 6 (six) hours. 01/28/22   Franklyn Sid SAILOR, MD  ondansetron  (ZOFRAN -ODT) 4 MG disintegrating tablet Take 1 tablet (4 mg total) by mouth every 8 (eight) hours as needed for nausea or vomiting. 05/05/23   Dreama Longs, MD  oxymetazoline  (AFRIN NASAL SPRAY) 0.05 % nasal spray Place 1 spray into both nostrils 2 (two) times daily as needed (nosebleed). 12/26/21   Mesner, Selinda, MD  Prenatal Vit-Fe Fumarate-FA (PRENATAL COMPLETE) 14-0.4 MG TABS Take 1 tablet by mouth daily. 09/14/22   Elnor Jayson LABOR, DO  promethazine  (PHENERGAN ) 12.5 MG tablet Take 1 tablet (12.5 mg total) by mouth every 6 (six) hours as needed for nausea or vomiting. 05/27/20   McDonald, Mia A, PA-C  albuterol  (PROVENTIL  HFA;VENTOLIN  HFA) 108 (90 Base) MCG/ACT inhaler Inhale 2 puffs into the lungs every 4 (four) hours as needed for wheezing or shortness of breath. Patient not taking: Reported on 05/27/2020 01/30/18 05/27/20  McDonald,  Mia A, PA-C    Allergies: Patient has no known allergies.    Review of Systems  Neurological:  Positive for headaches.    Updated Vital Signs BP 116/73   Pulse 90   Temp 98 F (36.7 C) (Oral)   Resp 18   Wt 54.4 kg   LMP 09/14/2023 (Exact Date)   SpO2 99%   BMI 20.60 kg/m   Physical Exam Vitals and nursing note reviewed.  Constitutional:      General: She is not in acute distress.    Appearance: She is well-developed.  HENT:     Head: Normocephalic and atraumatic.     Comments: Tender to palpation over the left frontal bone as well as the left temporal area.  No overlying lacerations or hematomas. Eyes:     Extraocular Movements: Extraocular movements intact.     Conjunctiva/sclera: Conjunctivae normal.     Pupils: Pupils are equal, round, and reactive to light.  Cardiovascular:     Rate and Rhythm: Normal rate and regular rhythm.     Heart sounds: No murmur heard.    Comments: 2+ radial and pedal pulses bilaterally Pulmonary:     Effort: Pulmonary effort is normal. No respiratory distress.     Breath sounds: Normal breath sounds.  Abdominal:     Palpations: Abdomen is soft.     Tenderness: There is no abdominal tenderness.  Musculoskeletal:        General:  No swelling.     Cervical back: Neck supple.     Comments: Nontender over the cervical spine.  She is tender over the distal thoracic spine and upper lumbar spine.  No tenderness over the coccyx.  No tenderness of the upper extremities bilaterally diffusely.  Full range of motion of the upper extremities bilaterally  Right lower extremity: General No erythema, edema, contusions, open wounds   Palpation Tender to palpation along the lateral malleolus as well as the ATFL and CFL.  Tender to the base of the fifth metatarsal.  Nontender along the proximal tibia and fibula. Non-tender of the medial malleolus Non-tender along the PTFL, achilles tendon Nontender along the first through 4th metatarsals, 1st-5th  phalanges  ROM Full ankle flexion, extension, inversion and eversion  Sensation: Sensation intact throughout the lower extremity   Skin:    General: Skin is warm and dry.     Capillary Refill: Capillary refill takes less than 2 seconds.  Neurological:     Mental Status: She is alert.     Comments: 5/5 strength with resisted ankle plantarflexion and dorsiflexion bilaterally Intact sensation to the bilateral upper and lower extremities  Psychiatric:        Mood and Affect: Mood normal.     (all labs ordered are listed, but only abnormal results are displayed) Labs Reviewed  PREGNANCY, URINE    EKG: None  Radiology: DG Foot Complete Right Result Date: 09/20/2023 CLINICAL DATA:  fall, pain; [id number] Trauma [id number] EXAM: RIGHT FOOT COMPLETE - 3+ VIEW; RIGHT ANKLE - COMPLETE 3+ VIEW COMPARISON:  None Available. FINDINGS: There is no evidence of fracture or dislocation. There is no evidence of arthropathy or other focal bone abnormality. Soft tissues are unremarkable. IMPRESSION: No acute displaced fracture or dislocation of the bones of the right foot and ankle. Electronically Signed   By: Morgane  Naveau M.D.   On: 09/20/2023 19:38   DG Ankle Complete Right Result Date: 09/20/2023 CLINICAL DATA:  fall, pain; [id number] Trauma [id number] EXAM: RIGHT FOOT COMPLETE - 3+ VIEW; RIGHT ANKLE - COMPLETE 3+ VIEW COMPARISON:  None Available. FINDINGS: There is no evidence of fracture or dislocation. There is no evidence of arthropathy or other focal bone abnormality. Soft tissues are unremarkable. IMPRESSION: No acute displaced fracture or dislocation of the bones of the right foot and ankle. Electronically Signed   By: Morgane  Naveau M.D.   On: 09/20/2023 19:38   DG Thoracic Spine 2 View Result Date: 09/20/2023 CLINICAL DATA:  fall, pain EXAM: THORACIC SPINE 2 VIEWS; LUMBAR SPINE - COMPLETE 4+ VIEW COMPARISON:  None Available. FINDINGS: There is no evidence of thoracolumbar spine fracture. Alignment is  normal. No other significant bone abnormalities are identified. C3-C7 cervical spine is unremarkable and well visualized on the lateral view. Likely external jewelry overlying the right breast, abdomen and pelvis. 3 x 2 cm rectangular densities overlying the right pelvis of unclear etiology. IMPRESSION: 1. No acute displaced fracture or traumatic listhesis of the thoracolumbar spine. 2. 3 x 2 cm rectangular densities overlying the right pelvis of unclear etiology. Correlate clinically. Electronically Signed   By: Morgane  Naveau M.D.   On: 09/20/2023 19:36   DG Lumbar Spine Complete Result Date: 09/20/2023 CLINICAL DATA:  fall, pain EXAM: THORACIC SPINE 2 VIEWS; LUMBAR SPINE - COMPLETE 4+ VIEW COMPARISON:  None Available. FINDINGS: There is no evidence of thoracolumbar spine fracture. Alignment is normal. No other significant bone abnormalities are identified. C3-C7 cervical spine is unremarkable and well visualized on  the lateral view. Likely external jewelry overlying the right breast, abdomen and pelvis. 3 x 2 cm rectangular densities overlying the right pelvis of unclear etiology. IMPRESSION: 1. No acute displaced fracture or traumatic listhesis of the thoracolumbar spine. 2. 3 x 2 cm rectangular densities overlying the right pelvis of unclear etiology. Correlate clinically. Electronically Signed   By: Morgane  Naveau M.D.   On: 09/20/2023 19:36   CT Head Wo Contrast Result Date: 09/20/2023 CLINICAL DATA:  Headache and vomiting. Fell in bathtub and hit head. EXAM: CT HEAD WITHOUT CONTRAST CT MAXILLOFACIAL WITHOUT CONTRAST TECHNIQUE: Multidetector CT imaging of the head and maxillofacial structures were performed using the standard protocol without intravenous contrast. Multiplanar CT image reconstructions of the maxillofacial structures were also generated. RADIATION DOSE REDUCTION: This exam was performed according to the departmental dose-optimization program which includes automated exposure control,  adjustment of the mA and/or kV according to patient size and/or use of iterative reconstruction technique. COMPARISON:  CT 12/27/2021 and 12/29/2021 FINDINGS: CT HEAD FINDINGS Brain: No evidence of acute infarction, hemorrhage, hydrocephalus, extra-axial collection or mass lesion/mass effect. Vascular: No hyperdense vessel or unexpected calcification. Skull: Normal. Negative for fracture or focal lesion. Other: None. CT MAXILLOFACIAL FINDINGS Osseous: No acute facial fracture or mandibular dislocation. Similar left maxillary incisor periapical cyst measuring 11 mm. Orbits: Negative. No traumatic or inflammatory finding. Sinuses: Clear. Soft tissues: Negative. IMPRESSION: 1. No acute intracranial abnormality. 2. No acute facial fracture. Electronically Signed   By: Norman Gatlin M.D.   On: 09/20/2023 19:20   CT Maxillofacial Wo Contrast Result Date: 09/20/2023 CLINICAL DATA:  Headache and vomiting. Fell in bathtub and hit head. EXAM: CT HEAD WITHOUT CONTRAST CT MAXILLOFACIAL WITHOUT CONTRAST TECHNIQUE: Multidetector CT imaging of the head and maxillofacial structures were performed using the standard protocol without intravenous contrast. Multiplanar CT image reconstructions of the maxillofacial structures were also generated. RADIATION DOSE REDUCTION: This exam was performed according to the departmental dose-optimization program which includes automated exposure control, adjustment of the mA and/or kV according to patient size and/or use of iterative reconstruction technique. COMPARISON:  CT 12/27/2021 and 12/29/2021 FINDINGS: CT HEAD FINDINGS Brain: No evidence of acute infarction, hemorrhage, hydrocephalus, extra-axial collection or mass lesion/mass effect. Vascular: No hyperdense vessel or unexpected calcification. Skull: Normal. Negative for fracture or focal lesion. Other: None. CT MAXILLOFACIAL FINDINGS Osseous: No acute facial fracture or mandibular dislocation. Similar left maxillary incisor periapical  cyst measuring 11 mm. Orbits: Negative. No traumatic or inflammatory finding. Sinuses: Clear. Soft tissues: Negative. IMPRESSION: 1. No acute intracranial abnormality. 2. No acute facial fracture. Electronically Signed   By: Norman Gatlin M.D.   On: 09/20/2023 19:20     Procedures   Medications Ordered in the ED  ondansetron  (ZOFRAN -ODT) disintegrating tablet 4 mg (4 mg Oral Given 09/20/23 1955)  ibuprofen  (ADVIL ) tablet 600 mg (600 mg Oral Given 09/20/23 1955)                                    Medical Decision Making Amount and/or Complexity of Data Reviewed Labs: ordered. Radiology: ordered.  Risk Prescription drug management.     Differential diagnosis includes but is not limited to fracture, dislocation, sprain, bone contusion, skull fracture, intracranial hemorrhage  ED Course:  Upon initial evaluation, patient is well-appearing, no acute distress.  Reporting headache to the left side of her head that started after hitting her head on the counter earlier today.  She did report 3 episodes of emesis earlier today after this occurred.  Does have some significant tenderness to palpation over the left frontal bone and left temple.  Will obtain head CT to rule out skull fracture or intracranial hemorrhage.  She did not have any cervical spine tenderness palpation, but did have lower thoracic and upper lumbar spinal tenderness palpation, will obtain thoracic and lumbar x-ray imaging. No numbness or weakness in the lower extremities, no concern for spinal cord injury. Also having tenderness to the lateral malleolus and ATFL of the right lower extremity and tenderness over the right fifth metatarsal base, will obtain x-ray of the patient's right ankle and foot. Compartments soft in the lower extremity, pulses 2+ bilaterally, no numbness, no concern for compartment syndrome.  Labs Ordered: I Ordered, and personally interpreted labs.  The pertinent results include:   Urine pregnancy  negative  Imaging Studies ordered: I ordered imaging studies including x-ray right foot, x-ray right ankle, x-ray thoracic and lumbar spine.  CT head, CT maxillofacial I independently visualized the imaging with scope of interpretation limited to determining acute life threatening conditions related to emergency care.  X-rays of the right foot, right ankle, thoracic and lumbar spine without acute abnormality CT head without evidence of intracranial hemorrhage.   CT maxillofacial without evidence of skull fracture I agree with the radiologist interpretation   Medications Given: Zofran  Ibuprofen   Upon re-evaluation, patient remains well-appearing with stable vitals.  Reports her headache and nausea has improved with the ibuprofen  and Zofran  given.  Discussed that her CT of the head did not reveal any intracranial hemorrhage or skull fractures.  Suspect that she sustained a concussion.  Discussed concussion precautions with patient including getting plenty of sleep at night, decreasing screen time, refraining from strenuous mental tasks.  The x-ray of her right foot and right ankle did not show any evidence of acute fracture or dislocation.  However, she is having pain along the lateral aspect of the ankle, and is having difficulty putting pressure on the foot.  Suspect she may have a ankle sprain.  She was provided a cam walking boot and was able to ambulate with the boot without difficulty.  Stable and appropriate for discharge home at this time    Impression: Concussion Right ankle sprain  Disposition:  The patient was discharged home with instructions to reduce screen time, get good sleep, and refrain from any strenuous mental activity for the next 24 hours.  May take Tylenol  and ibuprofen  as needed for headache and right ankle pain.  May ice the right ankle to help with pain and swelling.  Wear CAM boot provided, and wean out of the boot as tolerated.  Follow-up with orthopedics Dr. Celena  if ankle pain is not improving on its own within the next 1 to 2 weeks. Return precautions given.     This chart was dictated using voice recognition software, Dragon. Despite the best efforts of this provider to proofread and correct errors, errors may still occur which can change documentation meaning.       Final diagnoses:  Sprain of right ankle, unspecified ligament, initial encounter  Concussion without loss of consciousness, initial encounter    ED Discharge Orders     None          Veta Palma, PA-C 09/20/23 2113    Neysa Caron PARAS, DO 09/20/23 2148

## 2023-09-20 NOTE — ED Notes (Signed)
 Patient transported to X-ray

## 2023-09-20 NOTE — ED Notes (Signed)
 Patient transported to CT

## 2023-09-20 NOTE — Discharge Instructions (Signed)
 You were seen here today for your ankle pain.  It appears you have a ankle sprain which is a injury of the ligaments in your ankle.  This will heal with time.  Your x-ray today did not show any signs of a fracture or dislocation  You have been placed into a walking boot to help with the pain.  You may wean out of the boot as tolerated. Please perform gentle range of motion exercises as provided (like writing the ABC's with your ankle) to prevent stiffness in the ankle and to strengthen the ankle.  Please follow-up with the orthopedic provider listed below within the next week for further management of your ankle sprain.  You may take up to 1000mg  of tylenol  every 6 hours as needed for pain.  Do not take more then 4g per day.  You may use up to 600mg  ibuprofen  every 6 hours as needed for pain.  Do not exceed 2.4g of ibuprofen  per day.  You may ice and elevate the ankle to help with pain.  The CT of your head did not show any brain bleeds or skull fractures.  You appear to have a concussion which is a state of changed mental ability from trauma.  Refrain from any strenuous physical activities or any activities that require lots of focus for the next 24 hours. Decrease your screen time and get at least 8 hours of sleep at night.  You may return to work/school as tolerated.  The x-rays of your lumbar and did not show any abnormalities.   Return to the ER if: There is confusion or drowsiness You cannot awaken the injured person.  You have repetitive vomiting   You notice dizziness or unsteadiness which is getting worse, or inability to walk.  You lose consciousness You experience severe, persistent headaches not relieved by Tylenol  or ibuprofen  You cannot use arms or legs normally.  There are changes in pupil sizes. (This is the black center in the colored part of the eye)  You have changes in your vision There is clear or bloody discharge from the nose or ears.  Change in speech,  vision, swallowing, or understanding.  Localized weakness, numbness, tingling, or change in bowel or bladder control. Any other new or concerning symptoms

## 2023-10-08 ENCOUNTER — Encounter (HOSPITAL_BASED_OUTPATIENT_CLINIC_OR_DEPARTMENT_OTHER): Payer: Self-pay | Admitting: Emergency Medicine

## 2023-10-08 ENCOUNTER — Emergency Department (HOSPITAL_BASED_OUTPATIENT_CLINIC_OR_DEPARTMENT_OTHER)

## 2023-10-08 ENCOUNTER — Emergency Department (HOSPITAL_BASED_OUTPATIENT_CLINIC_OR_DEPARTMENT_OTHER): Admission: EM | Admit: 2023-10-08 | Discharge: 2023-10-08 | Disposition: A | Discharge status: Home / Self Care

## 2023-10-08 ENCOUNTER — Other Ambulatory Visit: Payer: Self-pay

## 2023-10-08 DIAGNOSIS — U071 COVID-19: Secondary | ICD-10-CM | POA: Insufficient documentation

## 2023-10-08 DIAGNOSIS — R059 Cough, unspecified: Secondary | ICD-10-CM | POA: Diagnosis present

## 2023-10-08 LAB — CBC
HCT: 38.9 % (ref 36.0–46.0)
Hemoglobin: 13.6 g/dL (ref 12.0–15.0)
MCH: 31.1 pg (ref 26.0–34.0)
MCHC: 35 g/dL (ref 30.0–36.0)
MCV: 88.8 fL (ref 80.0–100.0)
Platelets: 189 K/uL (ref 150–400)
RBC: 4.38 MIL/uL (ref 3.87–5.11)
RDW: 12 % (ref 11.5–15.5)
WBC: 4.7 K/uL (ref 4.0–10.5)
nRBC: 0 % (ref 0.0–0.2)

## 2023-10-08 LAB — COMPREHENSIVE METABOLIC PANEL WITH GFR
ALT: 8 U/L (ref 0–44)
AST: 15 U/L (ref 15–41)
Albumin: 4.8 g/dL (ref 3.5–5.0)
Alkaline Phosphatase: 66 U/L (ref 38–126)
Anion gap: 12 (ref 5–15)
BUN: 6 mg/dL (ref 6–20)
CO2: 22 mmol/L (ref 22–32)
Calcium: 9.3 mg/dL (ref 8.9–10.3)
Chloride: 102 mmol/L (ref 98–111)
Creatinine, Ser: 0.87 mg/dL (ref 0.44–1.00)
GFR, Estimated: >60 mL/min (ref >60)
Glucose, Bld: 95 mg/dL (ref 70–99)
Potassium: 3.7 mmol/L (ref 3.5–5.1)
Sodium: 137 mmol/L (ref 135–145)
Total Bilirubin: 1.1 mg/dL (ref 0.0–1.2)
Total Protein: 8 g/dL (ref 6.5–8.1)

## 2023-10-08 LAB — URINALYSIS, ROUTINE W REFLEX MICROSCOPIC
Bilirubin Urine: NEGATIVE
Glucose, UA: NEGATIVE mg/dL
Hgb urine dipstick: NEGATIVE
Ketones, ur: NEGATIVE mg/dL
Leukocytes,Ua: NEGATIVE
Nitrite: NEGATIVE
Protein, ur: NEGATIVE mg/dL
Specific Gravity, Urine: 1.02 (ref 1.005–1.030)
pH: 6.5 (ref 5.0–8.0)

## 2023-10-08 LAB — RESP PANEL BY RT-PCR (RSV, FLU A&B, COVID)  RVPGX2
Influenza A by PCR: NEGATIVE
Influenza B by PCR: NEGATIVE
Resp Syncytial Virus by PCR: NEGATIVE
SARS Coronavirus 2 by RT PCR: POSITIVE — AB

## 2023-10-08 LAB — LIPASE, BLOOD: Lipase: 19 U/L (ref 11–51)

## 2023-10-08 LAB — CBG MONITORING, ED: Glucose-Capillary: 97 mg/dL (ref 70–99)

## 2023-10-08 LAB — HCG, SERUM, QUALITATIVE: Preg, Serum: NEGATIVE

## 2023-10-08 LAB — GROUP A STREP BY PCR: Group A Strep by PCR: NOT DETECTED

## 2023-10-08 LAB — LACTIC ACID, PLASMA: Lactic Acid, Venous: 1 mmol/L (ref 0.5–1.9)

## 2023-10-08 MED ORDER — NAPROXEN 500 MG PO TABS: 500.0000 mg | ORAL_TABLET | Freq: Two times a day (BID) | ORAL | 0 refills | Status: AC

## 2023-10-08 MED ORDER — SODIUM CHLORIDE 0.9 % IV BOLUS
1000.0000 mL | Freq: Once | INTRAVENOUS | Status: AC
  Administered 2023-10-08: 1000 mL via INTRAVENOUS

## 2023-10-08 MED ORDER — ACETAMINOPHEN 500 MG PO TABS
1000.0000 mg | ORAL_TABLET | Freq: Once | ORAL | Status: AC
  Administered 2023-10-08: 1000 mg via ORAL
  Filled 2023-10-08: qty 2

## 2023-10-08 MED ORDER — ONDANSETRON 4 MG PO TBDP: 4.0000 mg | ORAL_TABLET | Freq: Three times a day (TID) | ORAL | 0 refills | Status: AC | PRN

## 2023-10-08 MED ORDER — ONDANSETRON HCL 4 MG/2ML IJ SOLN
4.0000 mg | Freq: Once | INTRAMUSCULAR | Status: AC | PRN
  Administered 2023-10-08: 4 mg via INTRAVENOUS
  Filled 2023-10-08: qty 2

## 2023-10-08 NOTE — Discharge Instructions (Addendum)
 It was a pleasure taking care of you here today  Your COVID test was positive.  This is likely the cause of your symptoms.  Your blood work was reassuring.  I have written you for some nausea medicine as well as some medication to help with any body aches and pains she may be having.  Would rotate with Tylenol  for any fever you may develop.  Do not take additional ibuprofen  or Aleve  while taking the naproxen  prescription.  Make sure to rest, drink plenty of fluids.  Follow-up outpatient, return for any worsening symptoms

## 2023-10-08 NOTE — ED Triage Notes (Signed)
 Pt c/o sore throat, body aches, emesis since this AM. Denies diarrhea.

## 2023-10-08 NOTE — ED Provider Notes (Signed)
 [place] [facility]MENT AT [facility] Provider Note   CSN: [id number] Arrival date & time: 10/08/23  1601    Patient presents with: URI and Emesis   Kathy Yang is a 23 y.o. female here for evaluation of feeling unwell.  Patient states yesterday while at work she developed sore throat and myalgias.  She took a Tylenol  at home.  She woke up this morning and began to have multiple episodes of NBNB emesis.  She feels generalized fatigue.  She associated congestion, rhinorrhea, cough productive of yellow sputum.  No sick contacts.  Unknown if she has had a fever however states she has had some chills.  No headache, neck pain, chest pain, shortness of breath, abdominal pain, urinary symptoms.  Denies chance of pregnancy.   HPI     Prior to Admission medications   Medication Sig Start Date End Date Taking? Authorizing Provider  naproxen  (NAPROSYN ) 500 MG tablet Take 1 tablet (500 mg total) by mouth 2 (two) times daily. 10/08/23  Yes Teng Decou A, PA-C  ondansetron  (ZOFRAN -ODT) 4 MG disintegrating tablet Take 1 tablet (4 mg total) by mouth every 8 (eight) hours as needed. 10/08/23  Yes Shamila Lerch A, PA-C  omeprazole  (PRILOSEC) 20 MG capsule Take 1 capsule (20 mg total) by mouth daily. 05/15/22 06/14/22  Cottie Donnice PARAS, MD  ondansetron  (ZOFRAN ) 4 MG tablet Take 1 tablet (4 mg total) by mouth every 6 (six) hours. 01/28/22   Franklyn Sid SAILOR, MD  oxymetazoline  (AFRIN NASAL SPRAY) 0.05 % nasal spray Place 1 spray into both nostrils 2 (two) times daily as needed (nosebleed). 12/26/21   Mesner, Selinda, MD  Prenatal Vit-Fe Fumarate-FA (PRENATAL COMPLETE) 14-0.4 MG TABS Take 1 tablet by mouth daily. 09/14/22   Elnor Jayson LABOR, DO  promethazine  (PHENERGAN ) 12.5 MG tablet Take 1 tablet (12.5 mg total) by mouth every 6 (six) hours as needed for nausea or vomiting. 05/27/20   McDonald, Mia A, PA-C  albuterol  (PROVENTIL  HFA;VENTOLIN  HFA) 108 (90 Base) MCG/ACT inhaler Inhale 2 puffs  into the lungs every 4 (four) hours as needed for wheezing or shortness of breath. Patient not taking: Reported on 05/27/2020 01/30/18 05/27/20  McDonald, Mia A, PA-C    Allergies: Patient has no known allergies.    Review of Systems  Constitutional:  Positive for activity change, appetite change and fatigue.  HENT:  Positive for congestion, rhinorrhea and sore throat. Negative for trouble swallowing and voice change.   Respiratory:  Positive for cough.   Cardiovascular: Negative.   Gastrointestinal:  Positive for nausea and vomiting. Negative for abdominal distention, abdominal pain, anal bleeding, blood in stool, constipation and diarrhea.  Genitourinary: Negative.   Musculoskeletal:  Positive for myalgias.  Skin: Negative.   Neurological: Negative.   All other systems reviewed and are negative.   Updated Vital Signs BP 101/70   Pulse 93   Temp 98.5 F (36.9 C)   Resp 16   Ht 5' 3 (1.6 m)   Wt 54.4 kg   LMP 09/14/2023 (Exact Date)   SpO2 100%   BMI 21.26 kg/m   Physical Exam Vitals and nursing note reviewed.  Constitutional:      General: She is not in acute distress.    Appearance: She is well-developed. She is not ill-appearing, toxic-appearing or diaphoretic.  HENT:     Head: Normocephalic and atraumatic.     Jaw: There is normal jaw occlusion.     Nose: Congestion and rhinorrhea present.  Mouth/Throat:     Lips: Pink.     Mouth: Mucous membranes are moist.     Pharynx: Uvula midline. Posterior oropharyngeal erythema and postnasal drip present. No pharyngeal swelling, oropharyngeal exudate or uvula swelling.     Tonsils: No tonsillar exudate or tonsillar abscesses.     Comments: Posterior pharynx erythematous. Eyes:     Pupils: Pupils are equal, round, and reactive to light.  Neck:     Trachea: Trachea and phonation normal.     Comments: Full ROM, no rigidity, meningismus Cardiovascular:     Rate and Rhythm: Tachycardia present.     Pulses: Normal pulses.           Radial pulses are 2+ on the right side and 2+ on the left side.     Heart sounds: Normal heart sounds.  Pulmonary:     Effort: Pulmonary effort is normal. No respiratory distress.     Breath sounds: Normal breath sounds and air entry.     Comments: Clear bilaterally, speaks in full sentences without difficulty Chest:     Comments: Nontender chest wall Abdominal:     General: There is no distension.     Palpations: Abdomen is soft.     Tenderness: There is no abdominal tenderness. There is no right CVA tenderness, left CVA tenderness, guarding or rebound. Negative signs include Murphy's sign and McBurney's sign.     Comments: Soft, nontender  Musculoskeletal:        General: Normal range of motion.     Cervical back: Full passive range of motion without pain and normal range of motion.     Comments: No bony tenderness, compartments soft, full range of motion  Skin:    General: Skin is warm and dry.     Capillary Refill: Capillary refill takes less than 2 seconds.  Neurological:     General: No focal deficit present.     Mental Status: She is alert.     Cranial Nerves: Cranial nerves 2-12 are intact.     Sensory: Sensation is intact.     Motor: Motor function is intact.  Psychiatric:        Mood and Affect: Mood normal.     (all labs ordered are listed, but only abnormal results are displayed) Labs Reviewed  RESP PANEL BY RT-PCR (RSV, FLU A&B, COVID)  RVPGX2 - Abnormal; Notable for the following components:      Result Value   SARS Coronavirus 2 by RT PCR POSITIVE (*)    All other components within normal limits  URINALYSIS, ROUTINE W REFLEX MICROSCOPIC - Abnormal; Notable for the following components:   APPearance HAZY (*)    All other components within normal limits  GROUP A STREP BY PCR  LIPASE, BLOOD  COMPREHENSIVE METABOLIC PANEL WITH GFR  CBC  HCG, SERUM, QUALITATIVE  LACTIC ACID, PLASMA  CBG MONITORING, ED    EKG: EKG Interpretation Date/Time:  Monday  October 08 2023 16:43:05 EDT Ventricular Rate:  108 PR Interval:  111 QRS Duration:  73 QT Interval:  312 QTC Calculation: 419 R Axis:   85  Text Interpretation: Sinus tachycardia Biatrial enlargement RSR' in V1 or V2, probably normal variant Compared with prior 01/28/2022 Confirmed by Gennaro Bouchard ([id number]) on 10/08/2023 4:52:40 PM  Radiology: [facility] Chest 2 View Result Date: 10/08/2023 CLINICAL DATA:  Cough. EXAM: CHEST - 2 VIEW COMPARISON:  05/05/2023. FINDINGS: Bilateral lung fields are clear. Bilateral costophrenic angles are clear. Normal cardio-mediastinal silhouette. No acute osseous abnormalities.  The soft tissues are within normal limits. IMPRESSION: No active cardiopulmonary disease. Electronically Signed   By: Ree Molt M.D.   On: 10/08/2023 17:24     Procedures   Medications Ordered in the ED  ondansetron  (ZOFRAN ) injection 4 mg (4 mg Intravenous Given 10/08/23 1707)  sodium chloride  0.9 % bolus 1,000 mL (0 mLs Intravenous Stopped 10/08/23 1828)  acetaminophen  (TYLENOL ) tablet 1,000 mg (1,000 mg Oral Given 10/08/23 4575)    23 year old here for evaluation of URI symptoms.  Yesterday developed sore throat, myalgias.  This morning felt some congestion, rhinorrhea, cough and multiple episodes of NBNB emesis.  Here she is tachycardic, does not appear septic.  Does appear mildly dehydrated.  Her posterior pharynx is erythematous uvula midline.  She has no neck stiffness or neck rigidity and low suspicion for meningitis.  Her heart and lungs are clear.  Her abdomen is soft, nontender.  Will plan on labs, imaging, pain management, IV fluids, antiemetics and reassess  Labs and imaging personally viewed interpreted EKG without ischemic. Chest x-ray without cardiomegaly, pulm edema, pneumothorax COVID positive Strep test negative Pregnancy test negative UA negative for infection Lactic 1.0 Lipase 19 Metabolic panel without significant abnormality CBC without significant  abnormality  Patient reassessed.  Vitals improved.  Feels improved after IV fluids, antiemetics.  Discussed her labs and imaging.  I suspect her symptoms likely due to COVID.  Will DC home with symptomatic management.  She will return for any worsening symptoms.  Low suspicion for sepsis, acute secondary bacterial infection, PE, pneumothorax, myocarditis, pericarditis, endocarditis, acute intraabd etiology,  PTA, RPA, PE, strep pharyngitis, meningitis.  She is tolerating p.o. intake here.  The patient has been appropriately medically screened and/or stabilized in the ED. I have low suspicion for any other emergent medical condition which would require further screening, evaluation or treatment in the ED or require inpatient management.  Patient is hemodynamically stable and in no acute distress.  Patient able to ambulate in department prior to ED.  Evaluation does not show acute pathology that would require ongoing or additional emergent interventions while in the emergency department or further inpatient treatment.  I have discussed the diagnosis with the patient and answered all questions.  Pain is been managed while in the emergency department and patient has no further complaints prior to discharge.  Patient is comfortable with plan discussed in room and is stable for discharge at this time.  I have discussed strict return precautions for returning to the emergency department.  Patient was encouraged to follow-up with PCP/specialist refer to at discharge.                                   Medical Decision Making Amount and/or Complexity of Data Reviewed External Data Reviewed: labs, radiology, ECG and notes. Labs: ordered. Decision-making details documented in ED Course. Radiology: ordered and independent interpretation performed. Decision-making details documented in ED Course. ECG/medicine tests: ordered and independent interpretation performed. Decision-making details documented in ED  Course.  Risk OTC drugs. Prescription drug management. Parenteral controlled substances. Decision regarding hospitalization. Diagnosis or treatment significantly limited by social determinants of health.        Final diagnoses:  COVID    ED Discharge Orders          Ordered    ondansetron  (ZOFRAN -ODT) 4 MG disintegrating tablet  Every 8 hours PRN        10/08/23 1836  naproxen  (NAPROSYN ) 500 MG tablet  2 times daily        10/08/23 1836               Rozena Fierro A, PA-C 10/08/23 1840    Kammerer, Megan L, DO 10/09/23 1318

## 2023-10-08 NOTE — ED Notes (Signed)
 Patient transported to CT
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
## 2024-01-01 ENCOUNTER — Encounter (HOSPITAL_BASED_OUTPATIENT_CLINIC_OR_DEPARTMENT_OTHER): Payer: Self-pay

## 2024-01-01 ENCOUNTER — Emergency Department (HOSPITAL_BASED_OUTPATIENT_CLINIC_OR_DEPARTMENT_OTHER)
Admission: EM | Admit: 2024-01-01 | Discharge: 2024-01-01 | Disposition: A | Discharge status: Home / Self Care | Attending: Emergency Medicine | Admitting: Emergency Medicine

## 2024-01-01 ENCOUNTER — Emergency Department (HOSPITAL_BASED_OUTPATIENT_CLINIC_OR_DEPARTMENT_OTHER)

## 2024-01-01 ENCOUNTER — Other Ambulatory Visit: Payer: Self-pay

## 2024-01-01 DIAGNOSIS — R059 Cough, unspecified: Secondary | ICD-10-CM | POA: Diagnosis present

## 2024-01-01 DIAGNOSIS — J45909 Unspecified asthma, uncomplicated: Secondary | ICD-10-CM | POA: Insufficient documentation

## 2024-01-01 DIAGNOSIS — J101 Influenza due to other identified influenza virus with other respiratory manifestations: Secondary | ICD-10-CM | POA: Diagnosis not present

## 2024-01-01 DIAGNOSIS — R000 Tachycardia, unspecified: Secondary | ICD-10-CM | POA: Insufficient documentation

## 2024-01-01 LAB — RESP PANEL BY RT-PCR (RSV, FLU A&B, COVID)  RVPGX2
Influenza A by PCR: POSITIVE — AB
Influenza B by PCR: NEGATIVE
Resp Syncytial Virus by PCR: NEGATIVE
SARS Coronavirus 2 by RT PCR: NEGATIVE

## 2024-01-01 MED ORDER — DEXAMETHASONE 10 MG/ML FOR PEDIATRIC ORAL USE: 10.0000 mg | Freq: Once | INTRAMUSCULAR | Status: DC

## 2024-01-01 MED ORDER — DEXAMETHASONE 10 MG/ML FOR PEDIATRIC ORAL USE
10.0000 mg | Freq: Once | INTRAMUSCULAR | Status: AC
  Administered 2024-01-01: 10 mg via ORAL

## 2024-01-01 MED ORDER — ALBUTEROL SULFATE HFA 108 (90 BASE) MCG/ACT IN AERS
1.0000 | INHALATION_SPRAY | Freq: Once | RESPIRATORY_TRACT | Status: AC
  Administered 2024-01-01: 2 via RESPIRATORY_TRACT
  Filled 2024-01-01: qty 6.7

## 2024-01-01 MED ORDER — ALBUTEROL SULFATE HFA 108 (90 BASE) MCG/ACT IN AERS: 1.0000 | INHALATION_SPRAY | Freq: Four times a day (QID) | RESPIRATORY_TRACT | 2 refills | Status: AC | PRN

## 2024-01-01 MED ORDER — ONDANSETRON 4 MG PO TBDP: 4.0000 mg | ORAL_TABLET | Freq: Three times a day (TID) | ORAL | 0 refills | Status: AC | PRN

## 2024-01-01 NOTE — ED Triage Notes (Signed)
 Reports headache, emesis, chest tightness, non-productive cough that started yesterday.

## 2024-01-01 NOTE — ED Provider Notes (Addendum)
 [place] [facility] AT [facility] Provider Note   CSN: [id number] Arrival date & time: 01/01/24  1526     Patient presents with: Cough   Kathy Yang is a 23 y.o. female past medical history significant for asthma presents today for headache, nausea, vomiting, and nonproductive cough that began yesterday.  Patient does report some chest tightness.  Patient denies fever, chills, chest pain, shortness of breath, abdominal pain, rhinorrhea, diplopia, tinnitus, neck stiffness or ear pain.    Cough Associated symptoms: headaches        Prior to Admission medications   Medication Sig Start Date End Date Taking? Authorizing Provider  albuterol  (VENTOLIN  HFA) 108 (90 Base) MCG/ACT inhaler Inhale 1-2 puffs into the lungs every 6 (six) hours as needed for wheezing or shortness of breath. 01/01/24  Yes Francis Ileana SAILOR, PA-C  ondansetron  (ZOFRAN -ODT) 4 MG disintegrating tablet Take 1 tablet (4 mg total) by mouth every 8 (eight) hours as needed. 01/01/24  Yes Remedy Corporan N, PA-C  naproxen  (NAPROSYN ) 500 MG tablet Take 1 tablet (500 mg total) by mouth 2 (two) times daily. 10/08/23   Henderly, Britni A, PA-C  omeprazole  (PRILOSEC) 20 MG capsule Take 1 capsule (20 mg total) by mouth daily. 05/15/22 06/14/22  Cottie Donnice PARAS, MD  ondansetron  (ZOFRAN ) 4 MG tablet Take 1 tablet (4 mg total) by mouth every 6 (six) hours. 01/28/22   Franklyn Sid SAILOR, MD  ondansetron  (ZOFRAN -ODT) 4 MG disintegrating tablet Take 1 tablet (4 mg total) by mouth every 8 (eight) hours as needed. 10/08/23   Henderly, Britni A, PA-C  oxymetazoline  (AFRIN NASAL SPRAY) 0.05 % nasal spray Place 1 spray into both nostrils 2 (two) times daily as needed (nosebleed). 12/26/21   Mesner, Selinda, MD  Prenatal Vit-Fe Fumarate-FA (PRENATAL COMPLETE) 14-0.4 MG TABS Take 1 tablet by mouth daily. 09/14/22   Elnor Jayson LABOR, DO  promethazine  (PHENERGAN ) 12.5 MG tablet Take 1 tablet (12.5 mg total) by mouth every 6 (six) hours as  needed for nausea or vomiting. 05/27/20   McDonald, Mia A, PA-C    Allergies: Patient has no known allergies.    Review of Systems  Respiratory:  Positive for cough and chest tightness.   Gastrointestinal:  Positive for nausea and vomiting.  Neurological:  Positive for headaches.    Updated Vital Signs BP 109/75 (BP Location: Right Arm)   Pulse (!) 107   Temp 98.1 F (36.7 C)   Resp 20   SpO2 100%   Physical Exam Vitals and nursing note reviewed.  Constitutional:      General: She is not in acute distress.    Appearance: Normal appearance. She is well-developed. She is not toxic-appearing.  HENT:     Head: Normocephalic and atraumatic.     Right Ear: External ear normal.     Left Ear: External ear normal.     Nose: Congestion and rhinorrhea present.     Mouth/Throat:     Mouth: Mucous membranes are moist.     Pharynx: Posterior oropharyngeal erythema present. No oropharyngeal exudate.  Eyes:     Extraocular Movements: Extraocular movements intact.     Conjunctiva/sclera: Conjunctivae normal.  Cardiovascular:     Rate and Rhythm: Regular rhythm. Tachycardia present.     Pulses: Normal pulses.     Heart sounds: Normal heart sounds. No murmur heard.    Comments: Minimally tachycardic Pulmonary:     Effort: Pulmonary effort is normal. No respiratory distress.     Breath  sounds: Normal breath sounds.  Abdominal:     General: There is no distension.     Palpations: Abdomen is soft.     Tenderness: There is no abdominal tenderness.  Musculoskeletal:        General: No swelling.     Cervical back: Normal range of motion and neck supple. No rigidity.  Skin:    General: Skin is warm and dry.     Capillary Refill: Capillary refill takes less than 2 seconds.  Neurological:     General: No focal deficit present.     Mental Status: She is alert and oriented to person, place, and time.  Psychiatric:        Mood and Affect: Mood normal.     (all labs ordered are listed,  but only abnormal results are displayed) Labs Reviewed  RESP PANEL BY RT-PCR (RSV, FLU A&B, COVID)  RVPGX2 - Abnormal; Notable for the following components:      Result Value   Influenza A by PCR POSITIVE (*)    All other components within normal limits    EKG: EKG Interpretation Date/Time:  Tuesday January 01 2024 15:34:06 EST Ventricular Rate:  109 PR Interval:  110 QRS Duration:  72 QT Interval:  318 QTC Calculation: 429 R Axis:   84  Text Interpretation: Sinus tachycardia since last tracing no significant change Confirmed by Lenor Hollering [id number]) on 01/01/2024 3:36:49 PM  Radiology: [facility] Chest 2 View Result Date: 01/01/2024 CLINICAL DATA:  Cough EXAM: CHEST - 2 VIEW COMPARISON:  Chest x-ray 10/08/2023 FINDINGS: The heart size and mediastinal contours are within normal limits. Both lungs are clear. The visualized skeletal structures are unremarkable. IMPRESSION: No active cardiopulmonary disease. Electronically Signed   By: Greig Pique M.D.   On: 01/01/2024 16:13     Procedures   Medications Ordered in the ED  dexamethasone (DECADRON) 1 MG/ML solution 10 mg (has no administration in time range)  albuterol  (VENTOLIN  HFA) 108 (90 Base) MCG/ACT inhaler 1-2 puff (has no administration in time range)                                    Medical Decision Making Amount and/or Complexity of Data Reviewed Labs: ordered. Radiology: ordered.  Risk Prescription drug management.   This patient presents to the ED for concern of cough, headache, nausea, vomiting differential diagnosis includes COVID, flu, RSV, pneumonia, viral URI,  Lab Tests:  I Ordered, and personally interpreted labs.  The pertinent results include: Influenza A positive   Imaging Studies ordered:  I ordered imaging studies including CXR  I independently visualized and interpreted imaging which showed No active cardiopulmonary disease I agree with the radiologist interpretation EKG which showed sinus  tachycardia   Medicines ordered and prescription drug management:  I ordered medication including albuterol  and Decadron    I have reviewed the patients home medicines and have made adjustments as needed   Problem List / ED Course:  Patient tolerating p.o. intake prior to discharge. Considered for admission or further workup however patients vital signs, physical exam, labs, and imaging are reassuring. Patient's symptoms likely due to influenza A infection.  Patient prescribed albuterol  for asthma and Zofran  for nausea and vomiting.  Patient advised to take Tylenol /Motrin  as needed for fever, Flonase  as needed for nasal congestion, and plain Mucinex as needed for chest congestion. Patient should follow-up with their primary care in the upcoming week  if their symptoms persist for further evaluation and workup.      Final diagnoses:  Influenza A    ED Discharge Orders          Ordered    albuterol  (VENTOLIN  HFA) 108 (90 Base) MCG/ACT inhaler  Every 6 hours PRN        01/01/24 1629    ondansetron  (ZOFRAN -ODT) 4 MG disintegrating tablet  Every 8 hours PRN        01/01/24 1629              Francis Ileana SAILOR, PA-C 01/01/24 1631    Lenor Hollering, MD 01/01/24 1712
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
## 2024-01-01 NOTE — Discharge Instructions (Addendum)
 Today you were seen for an influenza A infection.  You have been albuterol  for your asthma and Zofran  for nausea and vomiting.  You may alternate taking Tylenol  and Motrin  as needed for fever and pain, Flonase  as needed for nasal congestion, and plain Mucinex as needed for chest congestion.  You may return to work/school prior to the date listed on your excuse if you are fever free without Tylenol  or Motrin  for 24 hours and your symptoms are improving.  Thank you for letting us  treat you today. After reviewing your labs and imaging, I feel you are safe to go home. Please follow up with your PCP in the next several days and provide them with your records from this visit. Return to the Emergency Room if pain becomes severe or symptoms worsen.

## 2024-02-15 ENCOUNTER — Other Ambulatory Visit: Payer: Self-pay

## 2024-02-15 ENCOUNTER — Encounter (HOSPITAL_BASED_OUTPATIENT_CLINIC_OR_DEPARTMENT_OTHER): Payer: Self-pay

## 2024-02-15 DIAGNOSIS — K529 Noninfective gastroenteritis and colitis, unspecified: Secondary | ICD-10-CM | POA: Diagnosis not present

## 2024-02-15 DIAGNOSIS — Z79899 Other long term (current) drug therapy: Secondary | ICD-10-CM | POA: Insufficient documentation

## 2024-02-15 DIAGNOSIS — R1115 Cyclical vomiting syndrome unrelated to migraine: Secondary | ICD-10-CM | POA: Diagnosis not present

## 2024-02-15 DIAGNOSIS — R112 Nausea with vomiting, unspecified: Secondary | ICD-10-CM | POA: Diagnosis present

## 2024-02-15 LAB — COMPREHENSIVE METABOLIC PANEL WITH GFR
ALT: 10 U/L (ref 0–44)
AST: 16 U/L (ref 15–41)
Albumin: 4.6 g/dL (ref 3.5–5.0)
Alkaline Phosphatase: 57 U/L (ref 38–126)
Anion gap: 9 (ref 5–15)
BUN: 8 mg/dL (ref 6–20)
CO2: 26 mmol/L (ref 22–32)
Calcium: 9.1 mg/dL (ref 8.9–10.3)
Chloride: 104 mmol/L (ref 98–111)
Creatinine, Ser: 0.69 mg/dL (ref 0.44–1.00)
GFR, Estimated: >60 mL/min
Glucose, Bld: 101 mg/dL — ABNORMAL HIGH (ref 70–99)
Potassium: 4 mmol/L (ref 3.5–5.1)
Sodium: 139 mmol/L (ref 135–145)
Total Bilirubin: 0.5 mg/dL (ref 0.0–1.2)
Total Protein: 7.5 g/dL (ref 6.5–8.1)

## 2024-02-15 LAB — URINALYSIS, ROUTINE W REFLEX MICROSCOPIC
Bilirubin Urine: NEGATIVE
Glucose, UA: NEGATIVE mg/dL
Hgb urine dipstick: NEGATIVE
Ketones, ur: NEGATIVE mg/dL
Leukocytes,Ua: NEGATIVE
Nitrite: NEGATIVE
Protein, ur: NEGATIVE mg/dL
Specific Gravity, Urine: 1.02 (ref 1.005–1.030)
pH: 7 (ref 5.0–8.0)

## 2024-02-15 LAB — LIPASE, BLOOD: Lipase: 27 U/L (ref 11–51)

## 2024-02-15 LAB — CBC
HCT: 34.7 % — ABNORMAL LOW (ref 36.0–46.0)
Hemoglobin: 12.1 g/dL (ref 12.0–15.0)
MCH: 31.1 pg (ref 26.0–34.0)
MCHC: 34.9 g/dL (ref 30.0–36.0)
MCV: 89.2 fL (ref 80.0–100.0)
Platelets: 182 K/uL (ref 150–400)
RBC: 3.89 MIL/uL (ref 3.87–5.11)
RDW: 12.4 % (ref 11.5–15.5)
WBC: 5 K/uL (ref 4.0–10.5)
nRBC: 0 % (ref 0.0–0.2)

## 2024-02-15 LAB — PREGNANCY, URINE: Preg Test, Ur: NEGATIVE

## 2024-02-15 NOTE — ED Triage Notes (Signed)
 Pt arrives with c/o n/v that started this morning. Pt reports headache and ABD pain.

## 2024-02-16 ENCOUNTER — Emergency Department (HOSPITAL_BASED_OUTPATIENT_CLINIC_OR_DEPARTMENT_OTHER)
Admission: EM | Admit: 2024-02-16 | Discharge: 2024-02-16 | Disposition: A | Discharge status: Home / Self Care | Attending: Emergency Medicine | Admitting: Emergency Medicine

## 2024-02-16 DIAGNOSIS — K529 Noninfective gastroenteritis and colitis, unspecified: Secondary | ICD-10-CM

## 2024-02-16 DIAGNOSIS — R1115 Cyclical vomiting syndrome unrelated to migraine: Secondary | ICD-10-CM

## 2024-02-16 MED ORDER — METOCLOPRAMIDE HCL 5 MG/ML IJ SOLN: 10.0000 mg | Freq: Once | INTRAMUSCULAR | Status: DC

## 2024-02-16 MED ORDER — METOCLOPRAMIDE HCL 10 MG PO TABS: 10.0000 mg | ORAL_TABLET | Freq: Four times a day (QID) | ORAL | 0 refills | Status: AC

## 2024-02-16 MED ORDER — PANTOPRAZOLE SODIUM 20 MG PO TBEC: 20.0000 mg | DELAYED_RELEASE_TABLET | Freq: Every day | ORAL | 0 refills | Status: AC

## 2024-02-16 MED ORDER — FAMOTIDINE 20 MG PO TABS
20.0000 mg | ORAL_TABLET | Freq: Once | ORAL | Status: AC
  Administered 2024-02-16: 20 mg via ORAL
  Filled 2024-02-16: qty 1

## 2024-02-16 MED ORDER — METOCLOPRAMIDE HCL 5 MG/ML IJ SOLN
10.0000 mg | Freq: Once | INTRAMUSCULAR | Status: AC
  Administered 2024-02-16: 10 mg via INTRAMUSCULAR
  Filled 2024-02-16: qty 2

## 2024-02-16 MED ORDER — PANTOPRAZOLE SODIUM 20 MG PO TBEC: 20.0000 mg | DELAYED_RELEASE_TABLET | Freq: Once | ORAL | Status: DC

## 2024-02-16 MED ORDER — ALUM & MAG HYDROXIDE-SIMETH 200-200-20 MG/5ML PO SUSP
30.0000 mL | Freq: Once | ORAL | Status: AC
  Administered 2024-02-16: 30 mL via ORAL
  Filled 2024-02-16: qty 30

## 2024-02-16 MED ORDER — PANTOPRAZOLE SODIUM 40 MG PO TBEC
40.0000 mg | DELAYED_RELEASE_TABLET | Freq: Once | ORAL | Status: AC
  Administered 2024-02-16: 40 mg via ORAL
  Filled 2024-02-16: qty 1

## 2024-02-16 NOTE — ED Notes (Signed)
 Patient transferred from waiting room to ED treatment room. Assuming pt care at this time.

## 2024-02-16 NOTE — ED Provider Notes (Signed)
 " [place] [facility] Provider Note   CSN: [id number] Arrival date & time: 02/15/24  2134     History Chief Complaint  Patient presents with   Emesis    HPI Kathy Yang is a 24 y.o. female presenting for chief complaint of nausea/vomitting. Follows with gastroenterology in the past but lost to follow up. ~6 epsiodes of NBNB vomitting. Diarrhea as well. No medications at baseline Patient's recorded medical, surgical, social, medication list and allergies were reviewed in the Snapshot window as part of the initial history.   Review of Systems   Review of Systems  Constitutional:  Negative for chills and fever.  HENT:  Negative for ear pain and sore throat.   Eyes:  Negative for pain and visual disturbance.  Respiratory:  Negative for cough and shortness of breath.   Cardiovascular:  Negative for chest pain and palpitations.  Gastrointestinal:  Positive for nausea and vomiting. Negative for abdominal pain.  Genitourinary:  Negative for dysuria and hematuria.  Musculoskeletal:  Negative for arthralgias and back pain.  Skin:  Negative for color change and rash.  Neurological:  Negative for seizures and syncope.  All other systems reviewed and are negative.   Physical Exam Updated Vital Signs BP 103/64 (BP Location: Right Arm)   Pulse 79   Temp 98 F (36.7 C) (Oral)   Resp 16   Wt 54.4 kg   SpO2 99%   BMI 21.26 kg/m  Physical Exam Vitals and nursing note reviewed.  Constitutional:      General: She is not in acute distress.    Appearance: She is well-developed.  HENT:     Head: Normocephalic and atraumatic.  Eyes:     Conjunctiva/sclera: Conjunctivae normal.  Cardiovascular:     Rate and Rhythm: Normal rate and regular rhythm.     Heart sounds: No murmur heard. Pulmonary:     Effort: Pulmonary effort is normal. No respiratory distress.     Breath sounds: Normal breath sounds.  Abdominal:     Palpations: Abdomen is soft.      Tenderness: There is abdominal tenderness.  Musculoskeletal:        General: No swelling.     Cervical back: Neck supple.  Skin:    General: Skin is warm and dry.     Capillary Refill: Capillary refill takes less than 2 seconds.  Neurological:     Mental Status: She is alert.  Psychiatric:        Mood and Affect: Mood normal.      ED Course/ Medical Decision Making/ A&P    Procedures Procedures   Medications Ordered in ED Medications  metoCLOPramide  (REGLAN ) injection 10 mg (has no administration in time range)  pantoprazole  (PROTONIX ) EC tablet 20 mg (has no administration in time range)  alum & mag hydroxide-simeth (MAALOX/MYLANTA) 200-200-20 MG/5ML suspension 30 mL (has no administration in time range)  famotidine  (PEPCID ) tablet 20 mg (has no administration in time range)   Medical Decision Making:   Kathy Yang is a 24 y.o. female who presented to the ED today with abdominal pain and nausea/vomitting detailed above.    Patient placed on continuous vitals and telemetry monitoring while in ED which was reviewed periodically.  Complete initial physical exam performed, notably the patient  was with severe, violent vomiting. Otherwise hemodynamically stable. Notably, nonbilious/nonbloody emesis. Reviewed and confirmed nursing documentation for past medical history, family history, social history.    Initial Assessment:   With the patient's  presentation of severe vomiting, most likely diagnosis is cyclic vomiting syndrome. Other diagnoses were considered including (but not limited to) appendicitis, cholecystitis, small bowel obstruction, Boerhaave syndrome/Mallory-Weiss tears. These are considered less likely due to history of present illness and physical exam findings.   This is most consistent with an acute life/limb threatening illness complicated by underlying chronic conditions. This may or may not be complicated by metabolic derangements given severity of the  emesis. Most likely causes of patient's cyclic vomiting syndrome include: Diabetic gastroparesis Viral gastroenteritis Cannabinoid hyperemesis given patient's exposure  Initial Plan:  Aggressive treatment of nausea vomiting.   Primarily will proceed with IV fluids and ondansetron . Primary therapy failed, will escalate with IM Reglan  Screening labs including CBC and Metabolic panel to evaluate for infectious or metabolic etiology of disease.  Urinalysis with reflex culture ordered to evaluate for UTI or relevant urologic/nephrologic pathology.  Lipase to evaluate for pancreatitis CXR to evaluate for structural/infectious intrathoracic pathology and evaluation for free air in the setting of emesis.  I have considered further evaluation of patient's abdominal pain with cross-sectional imaging. Patient's pain is only present around episodes of nausea and vomiting.  They are not grossly peritonitic and the above severe pathologies are grossly favored less likely. Ultimately, patient's symptoms will be evaluated after medication therapy as above and if intractable symptoms will again reconsider imaging.  If symptoms have resolved, pretest probability would remain very low and below the risks of radiation exposure. EKG to evaluate for cardiac pathology Objective evaluation as below reviewed   Initial Study Results:   Laboratory  All laboratory results reviewed without evidence of clinically relevant pathology.    EKG EKG was reviewed independently. Rate, rhythm, axis, intervals all examined and without medically relevant abnormality. ST segments without concerns for elevations.     Reassessment and Plan:   Patient had complete resolution of their nausea vomiting and have been able to tolerate p.o. liquids at this time.  No metabolic crisis warranting more aggressive intervention at this time.  Will continue treatment with outpatient antiemetics and recommend follow-up with their primary care  provider.  Disposition:  I have considered need for hospitalization, however, considering all of the above, I believe this patient is stable for discharge at this time.  Patient/family educated about specific return precautions for given chief complaint and symptoms.  Patient/family educated about follow-up with PCP and gastro.     Patient/family expressed understanding of return precautions and need for follow-up. Patient spoken to regarding all imaging and laboratory results and appropriate follow up for these results. All education provided in verbal form with additional information in written form. Time was allowed for answering of patient questions. Patient discharged.    Emergency Department Medication Summary:   Medications  metoCLOPramide  (REGLAN ) injection 10 mg (has no administration in time range)  pantoprazole  (PROTONIX ) EC tablet 20 mg (has no administration in time range)  alum & mag hydroxide-simeth (MAALOX/MYLANTA) 200-200-20 MG/5ML suspension 30 mL (has no administration in time range)  famotidine  (PEPCID ) tablet 20 mg (has no administration in time range)           Clinical Impression:  1. Gastroenteritis   2. Cyclic vomiting syndrome      Discharge   Final Clinical Impression(s) / ED Diagnoses Final diagnoses:  Gastroenteritis  Cyclic vomiting syndrome    Rx / DC Orders ED Discharge Orders          Ordered    metoCLOPramide  (REGLAN ) 10 MG tablet  Every 6 hours  02/16/24 0052    pantoprazole  (PROTONIX ) 20 MG tablet  Daily        02/16/24 0052              Jerral Meth, MD 02/16/24 0053  "
